# Patient Record
Sex: Male | Born: 1955 | Race: White | Hispanic: No | Smoking: Former smoker
Health system: Southern US, Community
[De-identification: ages and names within clinical notes are randomized; demographics above are authoritative.]

## PROBLEM LIST (undated history)

## (undated) DIAGNOSIS — I10 Essential (primary) hypertension: Secondary | ICD-10-CM

## (undated) DIAGNOSIS — Z86718 Personal history of other venous thrombosis and embolism: Secondary | ICD-10-CM

## (undated) DIAGNOSIS — Z944 Liver transplant status: Secondary | ICD-10-CM

## (undated) DIAGNOSIS — K5190 Ulcerative colitis, unspecified, without complications: Secondary | ICD-10-CM

## (undated) HISTORY — DX: Essential (primary) hypertension: I10

## (undated) HISTORY — DX: Ulcerative colitis, unspecified, without complications: K51.90

## (undated) HISTORY — PX: APPENDECTOMY: SHX54

## (undated) HISTORY — DX: Liver transplant status: Z94.4

## (undated) HISTORY — DX: Personal history of other venous thrombosis and embolism: Z86.718

---

## 1999-10-17 HISTORY — PX: COLECTOMY: SHX59

## 2007-05-17 HISTORY — PX: LIVER TRANSPLANT: SHX410

## 2011-09-16 HISTORY — PX: REPLACEMENT TOTAL KNEE: SUR1224

## 2016-06-29 ENCOUNTER — Ambulatory Visit (INDEPENDENT_AMBULATORY_CARE_PROVIDER_SITE_OTHER): Payer: BLUE CROSS/BLUE SHIELD | Admitting: Osteopathic Medicine

## 2016-06-29 ENCOUNTER — Encounter: Payer: Self-pay | Admitting: Osteopathic Medicine

## 2016-06-29 VITALS — BP 182/120 | HR 69 | Ht 68.0 in | Wt 223.0 lb

## 2016-06-29 DIAGNOSIS — Z86718 Personal history of other venous thrombosis and embolism: Secondary | ICD-10-CM | POA: Insufficient documentation

## 2016-06-29 DIAGNOSIS — Z23 Encounter for immunization: Secondary | ICD-10-CM

## 2016-06-29 DIAGNOSIS — Z139 Encounter for screening, unspecified: Secondary | ICD-10-CM

## 2016-06-29 DIAGNOSIS — I10 Essential (primary) hypertension: Secondary | ICD-10-CM | POA: Diagnosis not present

## 2016-06-29 DIAGNOSIS — Z944 Liver transplant status: Secondary | ICD-10-CM

## 2016-06-29 DIAGNOSIS — I8393 Asymptomatic varicose veins of bilateral lower extremities: Secondary | ICD-10-CM

## 2016-06-29 DIAGNOSIS — Z9049 Acquired absence of other specified parts of digestive tract: Secondary | ICD-10-CM | POA: Insufficient documentation

## 2016-06-29 DIAGNOSIS — I8390 Asymptomatic varicose veins of unspecified lower extremity: Secondary | ICD-10-CM | POA: Insufficient documentation

## 2016-06-29 HISTORY — DX: Liver transplant status: Z94.4

## 2016-06-29 HISTORY — DX: Personal history of other venous thrombosis and embolism: Z86.718

## 2016-06-29 LAB — COMPLETE METABOLIC PANEL WITH GFR
ALBUMIN: 4 g/dL (ref 3.6–5.1)
ALK PHOS: 76 U/L (ref 40–115)
ALT: 23 U/L (ref 9–46)
AST: 18 U/L (ref 10–35)
BUN: 19 mg/dL (ref 7–25)
CO2: 24 mmol/L (ref 20–31)
Calcium: 9.1 mg/dL (ref 8.6–10.3)
Chloride: 105 mmol/L (ref 98–110)
Creat: 1.19 mg/dL (ref 0.70–1.25)
GFR, EST NON AFRICAN AMERICAN: 65 mL/min (ref >=60)
GFR, Est African American: 75 mL/min (ref >=60)
GLUCOSE: 91 mg/dL (ref 65–99)
POTASSIUM: 4.3 mmol/L (ref 3.5–5.3)
SODIUM: 140 mmol/L (ref 135–146)
Total Bilirubin: 0.5 mg/dL (ref 0.2–1.2)
Total Protein: 7.5 g/dL (ref 6.1–8.1)

## 2016-06-29 LAB — HEMOGLOBIN A1C
HEMOGLOBIN A1C: 5.5 % (ref <5.7)
MEAN PLASMA GLUCOSE: 111 mg/dL

## 2016-06-29 LAB — LIPID PANEL
CHOLESTEROL: 220 mg/dL — AB (ref 125–200)
HDL: 51 mg/dL (ref >=40)
LDL Cholesterol: 139 mg/dL — ABNORMAL HIGH (ref <130)
TRIGLYCERIDES: 150 mg/dL — AB (ref <150)
Total CHOL/HDL Ratio: 4.3 Ratio (ref <=5.0)
VLDL: 30 mg/dL (ref <30)

## 2016-06-29 LAB — CBC WITH DIFFERENTIAL/PLATELET
BASOS ABS: 92 {cells}/uL (ref 0–200)
Basophils Relative: 1 %
EOS PCT: 2 %
Eosinophils Absolute: 184 cells/uL (ref 15–500)
HCT: 43.9 % (ref 38.5–50.0)
HEMOGLOBIN: 14.4 g/dL (ref 13.2–17.1)
LYMPHS PCT: 16 %
Lymphs Abs: 1472 cells/uL (ref 850–3900)
MCH: 29.4 pg (ref 27.0–33.0)
MCHC: 32.8 g/dL (ref 32.0–36.0)
MCV: 89.6 fL (ref 80.0–100.0)
MONOS PCT: 8 %
MPV: 11.1 fL (ref 7.5–12.5)
Monocytes Absolute: 736 cells/uL (ref 200–950)
NEUTROS PCT: 73 %
Neutro Abs: 6716 cells/uL (ref 1500–7800)
Platelets: 226 10*3/uL (ref 140–400)
RBC: 4.9 MIL/uL (ref 4.20–5.80)
RDW: 14 % (ref 11.0–15.0)
WBC: 9.2 10*3/uL (ref 3.8–10.8)

## 2016-06-29 LAB — TSH: TSH: 2.81 m[IU]/L (ref 0.40–4.50)

## 2016-06-29 NOTE — Progress Notes (Signed)
HPI: Paul Fuentes is a 60 y.o. male  who presents to [name] today, 06/29/16,  for chief complaint of:  Chief Complaint  Patient presents with  . Establish Care    Varicose veins: Patient states that he is here to get another opinion from PCP which would be required for him to proceed with corrective procedure for varicose veins. Varicose veins diffusely on lower extremities, occasionally phlebitis issues, pain. Mother had similar issues. Patient has already been in consultation with hematologist regarding these, in consultation with [facility] as well. See below for review of records available  Hypertension: Blood pressure markedly elevated today and on review of previous records. Patient states that about 5 years ago he was put on medication but this made him "feel really terrible" so he didn't want to be on blood pressure medication. Patient denies chest pain, pressure, shortness of breath, headache or vision changes  Records reviewed:   History of distal right lower extremity DVT in 1998, treated with 3 months of warfarin.   History of unprovoked distal left lower extra may DVT in 2000, 6 weeks of enoxaparin.   History of autoimmune hepatitis and liver transplant in 2008, complicated by lower extremity DVT.   History of ulcerative colitis, status post colectomy 2001  Hematology consult 03/2016. Regarding recommendation for treatment of her current pains, patient with multiple DVT, vascular surgery requested second opinion. Hematology noted would be reasonable to recommend anticoagulation but patient was not interested in this. Was encouraged to consider prophylactic anticoagulation in increased risk areas such as surgery. Recommended rivaroxaban 10 mg daily or apixiban 2.5 mg twice a day 3-5 days before a procedure and up to one month after the procedure.  Blood pressure 169/113 per records in June 2017  Past medical, surgical, social  and family history reviewed: Past Medical History:  Diagnosis Date  . Hypertension    Past Surgical History:  Procedure Laterality Date  . COLECTOMY  2001  . LIVER TRANSPLANT  05/2007  . REPLACEMENT TOTAL KNEE  09/2011   Social History  Substance Use Topics  . Smoking status: Unknown If Ever Smoked  . Smokeless tobacco: Never Used  . Alcohol use Not on file   History reviewed. No pertinent family history.   Current medication list and allergy/intolerance information reviewed:   Current Outpatient Prescriptions  Medication Sig Dispense Refill  . mycophenolate (CELLCEPT) 500 MG tablet Take 500 mg by mouth daily.    Marland Kitchen omeprazole (PRILOSEC) 20 MG capsule Take 20 mg by mouth 2 (two) times daily.  0  . predniSONE (DELTASONE) 5 MG tablet Take 5 mg by mouth daily.  5  . tacrolimus (PROGRAF) 1 MG capsule Take 1 mg by mouth 2 (two) times daily.     No current facility-administered medications for this visit.    No Known Allergies    Review of Systems:  Constitutional:  No  fever, no chills, No recent illness, No unintentional weight changes. No significant fatigue.   HEENT: No  headache, no vision change, no hearing change, No sore throat, No  sinus pressure  Cardiac: No  chest pain, No  pressure, No palpitations, No  Orthopnea  Respiratory:  No  shortness of breath. No  Cough  Gastrointestinal: No  abdominal pain, No  nausea, No  vomiting,  No  blood in stool, No  diarrhea, No  constipation   Musculoskeletal: No new myalgia/arthralgia  Genitourinary: No  incontinence, No  abnormal genital bleeding, No abnormal  genital discharge  Skin: No  Rash, No other wounds/concerning lesions  Hem/Onc: (+)easy bruising/bleeding, No  abnormal lymph node  Endocrine: No cold intolerance,  No heat intolerance. No polyuria/polydipsia/polyphagia   Neurologic: No  weakness, No  dizziness, No  slurred speech/focal weakness/facial droop  Psychiatric: No  concerns with depression, No  concerns  with anxiety, No sleep problems, No mood problems  Exam:  BP (!) 182/120   Pulse 69   Ht 5\' 8"  (1.727 m)   Wt 223 lb (101.2 kg)   BMI 33.91 kg/m   Constitutional: VS see above. General Appearance: alert, well-developed, well-nourished, NAD  Eyes: Normal lids and conjunctive, non-icteric sclera  Ears, Nose, Mouth, Throat: MMM, Normal external inspection ears/nares/mouth/lips/gums.   Neck: No masses, trachea midline. No thyroid enlargement. No tenderness/mass appreciated. No lymphadenopathy  Respiratory: Normal respiratory effort. no wheeze, no rhonchi, no rales  Cardiovascular: S1/S2 normal, no murmur, no rub/gallop auscultated. RRR. No lower extremity edema. Superficial varicose veins on lower extremities bilaterally, none are significantly bulging or appearing infected   Musculoskeletal: Gait normal. No clubbing/cyanosis of digits.   Neurological: Normal balance/coordination. No tremor.   Skin: warm, dry, intact.  Psychiatric: Normal judgment/insight. Normal mood and affect. Oriented x3.    ASSESSMENT/PLAN:   Patient would like to wait for me to review records to see what blood pressure medication he's been on before, he is open to starting new blood pressure medication, but probably not today. Advised I would go ahead and write letter informing that I don't see a problem with undergoing varicose vein procedure, however his hypertension is certainly a complicating factor may put him at risk for such a procedure  Return to clinic for further follow-up on blood pressure, also immunocompromised state, we'll need to make sure that he is up-to-date on all vaccines, await records for review   Varicose vein of leg  Essential hypertension - Plan: CBC with Differential/Platelet, COMPLETE METABOLIC PANEL WITH GFR, Lipid panel, VITAMIN D 25 Hydroxy (Vit-D Deficiency, Fractures), TSH, Hemoglobin A1c  Need for prophylactic vaccination and inoculation against influenza - Plan: Flu  Vaccine QUAD 36+ mos IM  History of DVT (deep vein thrombosis)  Liver transplant recipient [facility])  History of total colectomy  Screening - Plan: Hepatitis C antibody, HIV antibody    Patient Instructions  What we did today:  I'm going to send letter recommending for varicose vein surgery/procedure, will also make notation of elevated blood pressure as this may affect your safety for such a procedure.  We'll request records from previous doctor about other medications tried for blood pressure.  Labs done today, we should have the results of these back in 2-3 days.  What to do next:  Plan to follow-up in this office in 2 weeks, by then I should have records from previous blood pressure treatments, we can decide on a strategy to help bring her blood pressure under control.  Let me know if you have any questions or concerns in the meantime, or if you don't hear back from us about your labs.  If any labs are significantly abnormal requiring a further discussion or urgent evaluation, we may ask you to schedule an appointment.  Let us know if you have any questions or concerns! Take care! -Dr. Mervyn SkeetersA.     Visit summary with medication list and pertinent instructions was printed for patient to review. All questions at time of visit were answered - patient instructed to contact office with any additional concerns. ER/RTC precautions were reviewed  with the patient. Follow-up plan: Return in about 2 weeks (around 07/13/2016), or sooner if needed, for BLOOD PRESSURE FOLLOW-UP.  Note: Total time spent 45 minutes, greater than 50% of the visit was spent face-to-face counseling and coordinating care for the following: The primary encounter diagnosis was Varicose vein of leg. Diagnoses of Essential hypertension, Need for prophylactic vaccination and inoculation against influenza, History of DVT (deep vein thrombosis), Liver transplant recipient [facility]), History of total colectomy, and Screening were also  pertinent to this visit.Marland Kitchen

## 2016-06-29 NOTE — Patient Instructions (Signed)
What we did today:  I'm going to send letter recommending for varicose vein surgery/procedure, will also make notation of elevated blood pressure as this may affect your safety for such a procedure.  We'll request records from previous doctor about other medications tried for blood pressure.  Labs done today, we should have the results of these back in 2-3 days.  What to do next:  Plan to follow-up in this office in 2 weeks, by then I should have records from previous blood pressure treatments, we can decide on a strategy to help bring her blood pressure under control.  Let me know if you have any questions or concerns in the meantime, or if you don't hear back from us about your labs.  If any labs are significantly abnormal requiring a further discussion or urgent evaluation, we may ask you to schedule an appointment.  Let us know if you have any questions or concerns! Take care! -Dr. Mervyn SkeetersA.

## 2016-06-30 ENCOUNTER — Encounter: Payer: Self-pay | Admitting: Osteopathic Medicine

## 2016-06-30 LAB — HEPATITIS C ANTIBODY: HCV AB: NEGATIVE

## 2016-06-30 LAB — VITAMIN D 25 HYDROXY (VIT D DEFICIENCY, FRACTURES): VIT D 25 HYDROXY: 40 ng/mL (ref 30–100)

## 2016-06-30 LAB — HIV ANTIBODY (ROUTINE TESTING W REFLEX): HIV 1&2 Ab, 4th Generation: NONREACTIVE

## 2016-07-13 ENCOUNTER — Ambulatory Visit (INDEPENDENT_AMBULATORY_CARE_PROVIDER_SITE_OTHER): Payer: BLUE CROSS/BLUE SHIELD | Admitting: Osteopathic Medicine

## 2016-07-13 ENCOUNTER — Encounter: Payer: Self-pay | Admitting: Osteopathic Medicine

## 2016-07-13 VITALS — BP 176/133 | HR 71 | Wt 221.0 lb

## 2016-07-13 DIAGNOSIS — I10 Essential (primary) hypertension: Secondary | ICD-10-CM

## 2016-07-13 DIAGNOSIS — Z944 Liver transplant status: Secondary | ICD-10-CM

## 2016-07-13 DIAGNOSIS — Z9049 Acquired absence of other specified parts of digestive tract: Secondary | ICD-10-CM

## 2016-07-13 DIAGNOSIS — Z23 Encounter for immunization: Secondary | ICD-10-CM | POA: Diagnosis not present

## 2016-07-13 DIAGNOSIS — Z7189 Other specified counseling: Secondary | ICD-10-CM

## 2016-07-13 MED ORDER — CHLORTHALIDONE 25 MG PO TABS: 25.0000 mg | ORAL_TABLET | Freq: Every day | ORAL | 2 refills | Status: DC

## 2016-07-13 NOTE — Progress Notes (Signed)
HPI: Paul Fuentes is a 60 y.o. male  who presents to [facility] today, 07/13/16,  for chief complaint of:  Chief Complaint  Patient presents with  . Follow-up    BLOOD PRESSURE    Varicose veins: Patient states that he is here to get another opinion from PCP which would be required for him to proceed with corrective procedure for varicose veins.Letter was written last visit, I think it was faxed that I cannot confirm this.  Hypertension: Blood pressure markedly elevated today and on review of previous records. Patient states that about 5 years ago he was put on medication but this made him "feel really terrible" so he didn't want to be on blood pressure medication. Patient denies chest pain, pressure, shortness of breath, headache or vision changes. He is amenable to starting antihypertensive medications, but he states he would rather live with a higher risk of heart attack/stroke then to be constantly dealing with the fatigue and bad/ill feeling he was having initially when he was on medications prior to this        History of distal right lower extremity DVT in 1998, treated with 3 months of warfarin.   History of unprovoked distal left lower extra may DVT in 2000, 6 weeks of enoxaparin.   History of autoimmune hepatitis and liver transplant in 1610, complicated by lower extremity DVT.   History of ulcerative colitis, status post colectomy 2001  Hematology consult 03/2016. Regarding recommendation for treatment of her current pains, patient with multiple DVT, vascular surgery requested second opinion. Hematology noted would be reasonable to recommend anticoagulation but patient was not interested in this. Was encouraged to consider prophylactic anticoagulation in increased risk areas such as surgery. Recommended rivaroxaban 10 mg daily or apixiban 2.5 mg twice a day 3-5 days before a procedure and up to one month after the procedure.  Blood pressure  169/113 per records in June 2017  Past medical, surgical, social and family history reviewed: Past Medical History:  Diagnosis Date  . Hypertension    Past Surgical History:  Procedure Laterality Date  . COLECTOMY  2001  . LIVER TRANSPLANT  05/2007  . REPLACEMENT TOTAL KNEE  09/2011   Social History  Substance Use Topics  . Smoking status: Former Research scientist (life sciences)  . Smokeless tobacco: Never Used  . Alcohol use No   No family history on file.   Current medication list and allergy/intolerance information reviewed:   Current Outpatient Prescriptions  Medication Sig Dispense Refill  . mycophenolate (CELLCEPT) 500 MG tablet Take 500 mg by mouth daily.    Marland Kitchen omeprazole (PRILOSEC) 20 MG capsule Take 20 mg by mouth 2 (two) times daily.  0  . predniSONE (DELTASONE) 5 MG tablet Take 5 mg by mouth daily with breakfast.    . tacrolimus (PROGRAF) 1 MG capsule Take 1 mg by mouth 2 (two) times daily.     No current facility-administered medications for this visit.    No Known Allergies    Review of Systems:  Constitutional:  No recent illness,  HEENT: No  headache, no vision change,  Cardiac: No  chest pain, No  pressur  Respiratory:  No  shortness of breath.   Psychiatric: No  concerns with depression, No  concerns with anxiety, No sleep problems, No mood problems  Exam:  BP (!) 176/133   Pulse 71   Wt 221 lb (100.2 kg)   BMI 33.60 kg/m   Constitutional: VS see above. General Appearance: alert, well-developed,  well-nourished, NAD  Ears, Nose, Mouth, Throat: MMM,  Neck: No masses, trachea midline.   Respiratory: Normal respiratory effort. no wheeze, no rhonchi, no rales  Cardiovascular: S1/S2 normal, no murmur, no rub/gallop auscultated. RRR. No lower extremity edema.  Musculoskeletal: Gait normal.  Psychiatric: Normal judgment/insight. Normal mood and affect. Oriented x3.   Recent Results (from the past 2160 hour(s))  CBC with Differential/Platelet     Status: None    Collection Time: 06/29/16 10:11 AM  Result Value Ref Range   WBC 9.2 3.8 - 10.8 K/uL   RBC 4.90 4.20 - 5.80 MIL/uL   Hemoglobin 14.4 13.2 - 17.1 g/dL   HCT 36.6 47.0 - 43.5 %   MCV 89.6 80.0 - 100.0 fL   MCH 29.4 27.0 - 33.0 pg   MCHC 32.8 32.0 - 36.0 g/dL   RDW 57.8 00.6 - 32.1 %   Platelets 226 140 - 400 K/uL   MPV 11.1 7.5 - 12.5 fL   Neutro Abs 6,716 1,500 - 7,800 cells/uL   Lymphs Abs 1,472 850 - 3,900 cells/uL   Monocytes Absolute 736 200 - 950 cells/uL   Eosinophils Absolute 184 15 - 500 cells/uL   Basophils Absolute 92 0 - 200 cells/uL   Neutrophils Relative % 73 %   Lymphocytes Relative 16 %   Monocytes Relative 8 %   Eosinophils Relative 2 %   Basophils Relative 1 %   Smear Review Criteria for review not met   COMPLETE METABOLIC PANEL WITH GFR     Status: None   Collection Time: 06/29/16 10:11 AM  Result Value Ref Range   Sodium 140 135 - 146 mmol/L   Potassium 4.3 3.5 - 5.3 mmol/L   Chloride 105 98 - 110 mmol/L   CO2 24 20 - 31 mmol/L   Glucose, Bld 91 65 - 99 mg/dL   BUN 19 7 - 25 mg/dL   Creat 5.31 9.81 - 3.16 mg/dL    Comment:   For patients > or = 63 years of age: The upper reference limit for Creatinine is approximately 13% higher for people identified as African-American.      Total Bilirubin 0.5 0.2 - 1.2 mg/dL   Alkaline Phosphatase 76 40 - 115 U/L   AST 18 10 - 35 U/L   ALT 23 9 - 46 U/L   Total Protein 7.5 6.1 - 8.1 g/dL   Albumin 4.0 3.6 - 5.1 g/dL   Calcium 9.1 8.6 - 55.3 mg/dL   GFR, Est African American 75 >=60 mL/min   GFR, Est Non African American 65 >=60 mL/min  Hepatitis C antibody     Status: None   Collection Time: 06/29/16 10:11 AM  Result Value Ref Range   HCV Ab NEGATIVE NEGATIVE  HIV antibody     Status: None   Collection Time: 06/29/16 10:11 AM  Result Value Ref Range   HIV 1&2 Ab, 4th Generation NONREACTIVE NONREACTIVE    Comment:   HIV-1 antigen and HIV-1/HIV-2 antibodies were not detected.  There is no laboratory  evidence of HIV infection.   HIV-1/2 Antibody Diff        Not indicated. HIV-1 RNA, Qual TMA          Not indicated.     PLEASE NOTE: This information has been disclosed to you from records whose confidentiality may be protected by state law. If your state requires such protection, then the state law prohibits you from making any further disclosure of the information without the  specific written consent of the person to whom it pertains, or as otherwise permitted by law. A general authorization for the release of medical or other information is NOT sufficient for this purpose.   The performance of this assay has not been clinically validated in patients less than 16 years old.   For additional information please refer to http://education.quest[facility].com/faq/FAQ106.  (This link is being provided for informational/educational purposes only.)     Lipid panel     Status: Abnormal   Collection Time: 06/29/16 10:11 AM  Result Value Ref Range   Cholesterol 220 (H) 125 - 200 mg/dL   Triglycerides 150 (H) <150 mg/dL   HDL 51 >=40 mg/dL   Total CHOL/HDL Ratio 4.3 <=5.0 Ratio   VLDL 30 <30 mg/dL   LDL Cholesterol 139 (H) <130 mg/dL    Comment:   Total Cholesterol/HDL Ratio:CHD Risk                        Coronary Heart Disease Risk Table                                        Men       Women          1/2 Average Risk              3.4        3.3              Average Risk              5.0        4.4           2X Average Risk              9.6        7.1           3X Average Risk             23.4       11.0 Use the calculated Patient Ratio above and the CHD Risk table  to determine the patient's CHD Risk.   VITAMIN D 25 Hydroxy (Vit-D Deficiency, Fractures)     Status: None   Collection Time: 06/29/16 10:11 AM  Result Value Ref Range   Vit D, 25-Hydroxy 40 30 - 100 ng/mL    Comment: Vitamin D Status           25-OH Vitamin D        Deficiency                <20 ng/mL         Insufficiency         20 - 29 ng/mL        Optimal             > or = 30 ng/mL   For 25-OH Vitamin D testing on patients on D2-supplementation and patients for whom quantitation of D2 and D3 fractions is required, the [facility] 25-OH VIT D, (D2,D3), LC/MS/MS is recommended: order code [id number] (patients > 2 yrs).   TSH     Status: None   Collection Time: 06/29/16 10:11 AM  Result Value Ref Range   TSH 2.81 0.40 - 4.50 mIU/L  Hemoglobin A1c     Status: None   Collection Time: 06/29/16 10:11 AM  Result Value Ref Range  Hgb A1c MFr Bld 5.5 <5.7 %    Comment:   For the purpose of screening for the presence of diabetes:   <5.7%       Consistent with the absence of diabetes 5.7-6.4 %   Consistent with increased risk for diabetes (prediabetes) >=6.5 %     Consistent with diabetes   This assay result is consistent with a decreased risk of diabetes.   Currently, no consensus exists regarding use of hemoglobin A1c for diagnosis of diabetes in children.   According to American Diabetes Association (ADA) guidelines, hemoglobin A1c <7.0% represents optimal control in non-pregnant diabetic patients. Different metrics may apply to specific patient populations. Standards of Medical Care in Diabetes (ADA).      Mean Plasma Glucose 111 mg/dL   The above lab results were reviewed in detail with the patient  ASSESSMENT/PLAN:   Essential hypertension - Plan: chlorthalidone (HYGROTON) 25 MG tablet  Need for diphtheria-tetanus-pertussis (Tdap) vaccine, adult/adolescent - Plan: Tdap vaccine greater than or equal to 7yo IM  Liver transplant recipient [facility])  History of total colectomy  Cardiac risk counseling - ASCVD risk nearly 18% in the next 10 years, much improved if blood pressure gets to goal    Patient Instructions  For getting blood pressure under better control, we are starting with low-dose medication for the first 2 weeks, we'll probably plan to increase this medicine. We will  need to do periodic monitoring of your kidney function over the course of the next few months. Plan to return to the office in 2 weeks for blood pressure check and to see how you're doing on the medication.   If you have any questions or concerns, or if there is anything else our office can do for you in the meantime, please don't hesitate to call!   Patient is a hadn't heard back from the vein center, I reprinted the letter for him to have, let me know if his anything else we can do for him  Visit summary with medication list and pertinent instructions was printed for patient to review. All questions at time of visit were answered - patient instructed to contact office with any additional concerns. ER/RTC precautions were reviewed with the patient. Follow-up plan: Return in about 2 weeks (around 07/27/2016) for blood pressure recheck on new medication.  Note: Total time spent 25 minutes, greater than 50% of the visit was spent face-to-face counseling and coordinating care for the following: The primary encounter diagnosis was Essential hypertension. Diagnoses of Need for diphtheria-tetanus-pertussis (Tdap) vaccine, adult/adolescent, Liver transplant recipient [facility]), History of total colectomy, and Cardiac risk counseling were also pertinent to this visit.Marland Kitchen

## 2016-07-13 NOTE — Patient Instructions (Addendum)
For getting blood pressure under better control, we are starting with low-dose medication for the first 2 weeks, we'll probably plan to increase this medicine. We will need to do periodic monitoring of your kidney function over the course of the next few months. Plan to return to the office in 2 weeks for blood pressure check and to see how you're doing on the medication.   If you have any questions or concerns, or if there is anything else our office can do for you in the meantime, please don't hesitate to call!

## 2016-07-27 ENCOUNTER — Encounter: Payer: Self-pay | Admitting: Osteopathic Medicine

## 2016-07-27 ENCOUNTER — Ambulatory Visit (INDEPENDENT_AMBULATORY_CARE_PROVIDER_SITE_OTHER): Payer: BLUE CROSS/BLUE SHIELD | Admitting: Osteopathic Medicine

## 2016-07-27 VITALS — BP 154/103 | HR 83 | Ht 68.0 in | Wt 219.0 lb

## 2016-07-27 DIAGNOSIS — Z7189 Other specified counseling: Secondary | ICD-10-CM

## 2016-07-27 DIAGNOSIS — I10 Essential (primary) hypertension: Secondary | ICD-10-CM | POA: Diagnosis not present

## 2016-07-27 NOTE — Progress Notes (Signed)
HPI: Paul Fuentes is a 60 y.o. male  who presents to [facility] today, 07/27/16,  for chief complaint of:  Chief Complaint  Patient presents with  . Follow-up  . Hypertension    Hypertension: Patient denies chest pain, pressure, shortness of breath, headache or vision changes. He is currently taking one half tablet of the chlorthalidone 25 mg, he tried going up to one whole tablet but was experiencing some feelings of fatigue, has noticed a change in his libido since starting the medication. In the past, had trouble with medications causing tiredness, fatigue, just not feeling right. Patient and I have agreed to go slow with the medications, he had significant hypertension for a long time and has been resistant to starting medicines until now.       Past medical, surgical, social and family history reviewed: Past Medical History:  Diagnosis Date  . 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  History of unprovoked distal left lower extra may DVT in 2000, 6 weeks of enoxaparin.  History of autoimmune hepatitis and liver transplant in 4481, complicated by lower extremity DVT.  History of ulcerative colitis, status post colectomy 2001 Hematology consult 03/2016. Regarding recommendation for treatment of her current pains, patient with multiple DVT, vascular surgery requested second opinion. Hematology noted would be reasonable to recommend anticoagulation but patient was not interested in this. Was encouraged to consider prophylactic anticoagulation in increased risk areas such as surgery. Recommended rivaroxaban 10 mg daily or apixiban 2.5 mg twice a day 3-5 days before a procedure and up to one month after the procedure. Blood pressure 169/113 per records in June 2017  . History of total colectomy 06/29/2016  . Hypertension   . Liver transplant recipient [facility]) 06/29/2016    Past Surgical History:  Procedure Laterality Date  . COLECTOMY  2001  . LIVER TRANSPLANT  05/2007  . REPLACEMENT TOTAL KNEE  09/2011   Social History  Substance Use Topics  . Smoking status: Former Research scientist (life sciences)  . Smokeless tobacco: Never Used  . Alcohol use No   No family history on file.   Current medication list and allergy/intolerance information reviewed:   Current Outpatient Prescriptions  Medication Sig Dispense Refill  . chlorthalidone (HYGROTON) 25 MG tablet Take 1 tablet (25 mg total) by mouth daily. (Can start with 1/2 tablet - 12.5 mg total - for first two weeks) 30 tablet 2  . mycophenolate (CELLCEPT) 500 MG tablet Take 500 mg by mouth daily.    Marland Kitchen omeprazole (PRILOSEC) 20 MG capsule Take 20 mg by mouth 2 (two) times daily.  0  . predniSONE (DELTASONE) 5 MG tablet Take 5 mg by mouth daily with breakfast.    . tacrolimus (PROGRAF) 1 MG capsule Take 1 mg by mouth 2 (two) times daily.     No current facility-administered medications for this visit.    No Known Allergies    Review of Systems:  Constitutional:  No recent illness,  HEENT: No  headache, no vision change  Cardiac: No  chest pain, No  pressure  Respiratory:  No  shortness of breath.   Exam:  BP (!) 154/103   Pulse 83   Ht 5' 8" (1.727 m)   Wt 219 lb (99.3 kg)   BMI 33.30 kg/m   Constitutional: VS see above. General Appearance: alert, well-developed, well-nourished, NAD  Ears, Nose, Mouth, Throat: MMM  Neck: No masses, trachea midline.   Respiratory: Normal respiratory effort. no wheeze, no rhonchi, no rales  Cardiovascular: S1/S2 normal, no murmur, no rub/gallop auscultated. RRR. No lower extremity edema.   Recent Results (from the past 2160 hour(s))  CBC with Differential/Platelet     Status: None   Collection Time: 06/29/16 10:11 AM  Result Value Ref Range   WBC 9.2 3.8 - 10.8 K/uL   RBC 4.90 4.20 - 5.80 MIL/uL   Hemoglobin 14.4 13.2 - 17.1 g/dL   HCT 43.9 38.5 - 50.0 %   MCV 89.6  80.0 - 100.0 fL   MCH 29.4 27.0 - 33.0 pg   MCHC 32.8 32.0 - 36.0 g/dL   RDW 14.0 11.0 - 15.0 %   Platelets 226 140 - 400 K/uL   MPV 11.1 7.5 - 12.5 fL   Neutro Abs 6,716 1,500 - 7,800 cells/uL   Lymphs Abs 1,472 850 - 3,900 cells/uL   Monocytes Absolute 736 200 - 950 cells/uL   Eosinophils Absolute 184 15 - 500 cells/uL   Basophils Absolute 92 0 - 200 cells/uL   Neutrophils Relative % 73 %   Lymphocytes Relative 16 %   Monocytes Relative 8 %   Eosinophils Relative 2 %   Basophils Relative 1 %   Smear Review Criteria for review not met   COMPLETE METABOLIC PANEL WITH GFR     Status: None   Collection Time: 06/29/16 10:11 AM  Result Value Ref Range   Sodium 140 135 - 146 mmol/L   Potassium 4.3 3.5 - 5.3 mmol/L   Chloride 105 98 - 110 mmol/L   CO2 24 20 - 31 mmol/L   Glucose, Bld 91 65 - 99 mg/dL   BUN 19 7 - 25 mg/dL   Creat 1.19 0.70 - 1.25 mg/dL    Comment:   For patients > or = 62 years of age: The upper reference limit for Creatinine is approximately 13% higher for people identified as African-American.      Total Bilirubin 0.5 0.2 - 1.2 mg/dL   Alkaline Phosphatase 76 40 - 115 U/L   AST 18 10 - 35 U/L   ALT 23 9 - 46 U/L   Total Protein 7.5 6.1 - 8.1 g/dL   Albumin 4.0 3.6 - 5.1 g/dL   Calcium 9.1 8.6 - 10.3 mg/dL   GFR, Est African American 75 >=60 mL/min   GFR, Est Non African American 65 >=60 mL/min  Hepatitis C antibody     Status: None   Collection Time: 06/29/16 10:11 AM  Result Value Ref Range   HCV Ab NEGATIVE NEGATIVE  HIV antibody     Status: None   Collection Time: 06/29/16 10:11 AM  Result Value Ref Range   HIV 1&2 Ab, 4th Generation NONREACTIVE NONREACTIVE    Comment:   HIV-1 antigen and HIV-1/HIV-2 antibodies were not detected.  There is no laboratory evidence of HIV infection.   HIV-1/2 Antibody Diff        Not indicated. HIV-1 RNA, Qual TMA          Not indicated.     PLEASE NOTE: This information has been disclosed to you from  records whose confidentiality may be protected by state law. If your state requires such protection, then the state law prohibits you from making any further disclosure of the information without the specific written consent of the person to whom it pertains, or as otherwise permitted by law. A general authorization for the release of  medical or other information is NOT sufficient for this purpose.   The performance of this assay has not been clinically validated in patients less than 18 years old.   For additional information please refer to http://education.questdiagnostics.com/faq/FAQ106.  (This link is being provided for informational/educational purposes only.)     Lipid panel     Status: Abnormal   Collection Time: 06/29/16 10:11 AM  Result Value Ref Range   Cholesterol 220 (H) 125 - 200 mg/dL   Triglycerides 150 (H) <150 mg/dL   HDL 51 >=40 mg/dL   Total CHOL/HDL Ratio 4.3 <=5.0 Ratio   VLDL 30 <30 mg/dL   LDL Cholesterol 139 (H) <130 mg/dL    Comment:   Total Cholesterol/HDL Ratio:CHD Risk                        Coronary Heart Disease Risk Table                                        Men       Women          1/2 Average Risk              3.4        3.3              Average Risk              5.0        4.4           2X Average Risk              9.6        7.1           3X Average Risk             23.4       11.0 Use the calculated Patient Ratio above and the CHD Risk table  to determine the patient's CHD Risk.   VITAMIN D 25 Hydroxy (Vit-D Deficiency, Fractures)     Status: None   Collection Time: 06/29/16 10:11 AM  Result Value Ref Range   Vit D, 25-Hydroxy 40 30 - 100 ng/mL    Comment: Vitamin D Status           25-OH Vitamin D        Deficiency                <20 ng/mL        Insufficiency         20 - 29 ng/mL        Optimal             > or = 30 ng/mL   For 25-OH Vitamin D testing on patients on D2-supplementation and patients for whom quantitation of D2 and  D3 fractions is required, the [facility] 25-OH VIT D, (D2,D3), LC/MS/MS is recommended: order code [id number] (patients > 2 yrs).   TSH     Status: None   Collection Time: 06/29/16 10:11 AM  Result Value Ref Range   TSH 2.81 0.40 - 4.50 mIU/L  Hemoglobin A1c     Status: None   Collection Time: 06/29/16 10:11 AM  Result Value Ref Range   Hgb A1c MFr Bld 5.5 <5.7 %    Comment:   For the purpose of screening for the presence of diabetes:   <  5.7%       Consistent with the absence of diabetes 5.7-6.4 %   Consistent with increased risk for diabetes (prediabetes) >=6.5 %     Consistent with diabetes   This assay result is consistent with a decreased risk of diabetes.   Currently, no consensus exists regarding use of hemoglobin A1c for diagnosis of diabetes in children.   According to American Diabetes Association (ADA) guidelines, hemoglobin A1c <7.0% represents optimal control in non-pregnant diabetic patients. Different metrics may apply to specific patient populations. Standards of Medical Care in Diabetes (ADA).      Mean Plasma Glucose 111 mg/dL    ASSESSMENT/PLAN:   Essential hypertension  Cardiac risk counseling   Okay to continue at half versus whole tablet of the chlorthalidone. Consider medication change if side effects persist or if patient is unable to tolerate higher dose can consider additional medication.   Visit summary with medication list and pertinent instructions was printed for patient to review. All questions at time of visit were answered - patient instructed to contact office with any additional concerns. ER/RTC precautions were reviewed with the patient. Follow-up plan: Return in about 6 weeks (around 09/07/2016) for BLOOD PRESSURE FOLLOW-UP, SOONER IF NEEDED.

## 2016-08-25 ENCOUNTER — Ambulatory Visit (INDEPENDENT_AMBULATORY_CARE_PROVIDER_SITE_OTHER): Payer: BLUE CROSS/BLUE SHIELD | Admitting: Family Medicine

## 2016-08-25 ENCOUNTER — Encounter: Payer: Self-pay | Admitting: Family Medicine

## 2016-08-25 ENCOUNTER — Ambulatory Visit (INDEPENDENT_AMBULATORY_CARE_PROVIDER_SITE_OTHER): Payer: BLUE CROSS/BLUE SHIELD

## 2016-08-25 VITALS — BP 143/93 | HR 98 | Ht 68.0 in | Wt 221.0 lb

## 2016-08-25 DIAGNOSIS — R059 Cough, unspecified: Secondary | ICD-10-CM

## 2016-08-25 DIAGNOSIS — Z944 Liver transplant status: Secondary | ICD-10-CM | POA: Diagnosis not present

## 2016-08-25 DIAGNOSIS — R05 Cough: Secondary | ICD-10-CM

## 2016-08-25 MED ORDER — BENZONATATE 200 MG PO CAPS: 200.0000 mg | ORAL_CAPSULE | Freq: Three times a day (TID) | ORAL | 0 refills | Status: DC | PRN

## 2016-08-25 MED ORDER — AZITHROMYCIN 250 MG PO TABS: 250.0000 mg | ORAL_TABLET | Freq: Every day | ORAL | 0 refills | Status: DC

## 2016-08-25 NOTE — Patient Instructions (Signed)
Thank you for coming in today. If your belly pain worsens, or you have high fever, bad vomiting, blood in your stool or black tarry stool go to the Emergency Room.  Take the medication prescribed.  Return as needed.  Call or go to the emergency room if you get worse, have trouble breathing, have chest pains, or palpitations.    Acute Bronchitis Bronchitis is inflammation of the airways that extend from the windpipe into the lungs (bronchi). The inflammation often causes mucus to develop. This leads to a cough, which is the most common symptom of bronchitis.  In acute bronchitis, the condition usually develops suddenly and goes away over time, usually in a couple weeks. Smoking, allergies, and asthma can make bronchitis worse. Repeated episodes of bronchitis may cause further lung problems.  CAUSES Acute bronchitis is most often caused by the same virus that causes a cold. The virus can spread from person to person (contagious) through coughing, sneezing, and touching contaminated objects. SIGNS AND SYMPTOMS   Cough.   Fever.   Coughing up mucus.   Body aches.   Chest congestion.   Chills.   Shortness of breath.   Sore throat.  DIAGNOSIS  Acute bronchitis is usually diagnosed through a physical exam. Your health care provider will also ask you questions about your medical history. Tests, such as chest X-rays, are sometimes done to rule out other conditions.  TREATMENT  Acute bronchitis usually goes away in a couple weeks. Oftentimes, no medical treatment is necessary. Medicines are sometimes given for relief of fever or cough. Antibiotic medicines are usually not needed but may be prescribed in certain situations. In some cases, an inhaler may be recommended to help reduce shortness of breath and control the cough. A cool mist vaporizer may also be used to help thin bronchial secretions and make it easier to clear the chest.  HOME CARE INSTRUCTIONS  Get plenty of rest.    Drink enough fluids to keep your urine clear or pale yellow (unless you have a medical condition that requires fluid restriction). Increasing fluids may help thin your respiratory secretions (sputum) and reduce chest congestion, and it will prevent dehydration.   Take medicines only as directed by your health care provider.  If you were prescribed an antibiotic medicine, finish it all even if you start to feel better.  Avoid smoking and secondhand smoke. Exposure to cigarette smoke or irritating chemicals will make bronchitis worse. If you are a smoker, consider using nicotine gum or skin patches to help control withdrawal symptoms. Quitting smoking will help your lungs heal faster.   Reduce the chances of another bout of acute bronchitis by washing your hands frequently, avoiding people with cold symptoms, and trying not to touch your hands to your mouth, nose, or eyes.   Keep all follow-up visits as directed by your health care provider.  SEEK MEDICAL CARE IF: Your symptoms do not improve after 1 week of treatment.  SEEK IMMEDIATE MEDICAL CARE IF:  You develop an increased fever or chills.   You have chest pain.   You have severe shortness of breath.  You have bloody sputum.   You develop dehydration.  You faint or repeatedly feel like you are going to pass out.  You develop repeated vomiting.  You develop a severe headache. MAKE SURE YOU:   Understand these instructions.  Will watch your condition.  Will get help right away if you are not doing well or get worse.   This information is  not intended to replace advice given to you by your health care provider. Make sure you discuss any questions you have with your health care provider.   Document Released: 11/09/2004 Document Revised: 10/23/2014 Document Reviewed: 03/25/2013 Elsevier Interactive Patient Education [employer].

## 2016-08-25 NOTE — Progress Notes (Signed)
Sheilah Minsric Kindt is a 60 y.o. male [name] today for cough. Patient is a 3 week history of moderately bothersome cough. Cough is nonproductive but does interfere with sleep. He notes a very occasional shortness of breath but denies any chest pain or palpitations. No fevers or chills. He's tried occasional over-the-counter medications which has not helped much. He is a pertinent medical history for liver transplant currently doing well with CellCept and prednisone and Prograf.   Past Medical History:  Diagnosis Date  . 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  History of unprovoked distal left lower extra may DVT in 2000, 6 weeks of enoxaparin.  History of autoimmune hepatitis and liver transplant in 2008, complicated by lower extremity DVT.  History of ulcerative colitis, status post colectomy 2001 Hematology consult 03/2016. Regarding recommendation for treatment of her current pains, patient with multiple DVT, vascular surgery requested second opinion. Hematology noted would be reasonable to recommend anticoagulation but patient was not interested in this. Was encouraged to consider prophylactic anticoagulation in increased risk areas such as surgery. Recommended rivaroxaban 10 mg daily or apixiban 2.5 mg twice a day 3-5 days before a procedure and up to one month after the procedure. Blood pressure 169/113 per records in June 2017  . History of total colectomy 06/29/2016  . Hypertension   . Liver transplant recipient [facility] 06/29/2016   Past Surgical History:  Procedure Laterality Date  . COLECTOMY  2001  . LIVER TRANSPLANT  05/2007  . REPLACEMENT TOTAL KNEE  09/2011   Social History  Substance Use Topics  . Smoking status: Former Games developermoker  . Smokeless tobacco: Never  Used  . Alcohol use No   family history is not on file.  ROS as above:  Medications: Current Outpatient Prescriptions  Medication Sig Dispense Refill  . chlorthalidone (HYGROTON) 25 MG tablet Take 1 tablet (25 mg total) by mouth daily. (Can start with 1/2 tablet - 12.5 mg total - for first two weeks) 30 tablet 2  . mycophenolate (CELLCEPT) 500 MG tablet Take 500 mg by mouth daily.    Marland Kitchen. omeprazole (PRILOSEC) 20 MG capsule Take 20 mg by mouth 2 (two) times daily.  0  . predniSONE (DELTASONE) 5 MG tablet Take 5 mg by mouth daily with breakfast.    . tacrolimus (PROGRAF) 1 MG capsule Take 1 mg by mouth 2 (two) times daily.     No current facility-administered medications for this visit.    No Known Allergies  Health Maintenance Health Maintenance  Topic Date Due  . ZOSTAVAX  12/15/2013  . COLONOSCOPY  07/13/2026  . TETANUS/TDAP  07/13/2026  . INFLUENZA VACCINE  Completed  . Hepatitis C Screening  Completed  . HIV Screening  Completed     Exam:  BP (!) 143/93   Pulse 98   Ht 5\' 8"  (1.727 m)   Wt 221 lb (100.2 kg)   SpO2 97%   BMI 33.60 kg/m  Gen: Well NAD HEENT: EOMI,  MMM  Lungs: Normal work of breathing. CTABL Heart: RRR no MRG Abd: NABS, Soft. Nondistended, Nontender Exts: Brisk capillary refill, warm and well perfused.   Chest x-ray: Preliminary review shows: Slight blunting of the anterior portion of the lung field seen on the lateral chest x-ray. Suspect this is due to postsurgical changes although I don't have  any prior films to compare to. Awaiting formal radiology review Awaiting formal radiology review   No results found for this or any previous visit (from the past 72 hour(s)). No results found.    Assessment and Plan: 60 y.o. male with cough for 3 weeks. Patient is well-appearing does not appear to be ill at all. He does have a concerning medical history for liver transplant and is mildly immunocompromised. Awaiting for radiology review from chest x-ray  although pneumonia is doubtful. Treat with azithromycin and Tessalon for cough suppression. Follow-up with PCP in the near future.   Orders Placed This Encounter  Procedures  . DG Chest 2 View    Order Specific Question:   Reason for exam:    Answer:   Cough, assess intra-thoracic pathology    Order Specific Question:   Preferred imaging [facility]?    [facility]    Discussed warning signs or symptoms. Please see discharge instructions. Patient expresses understanding.

## 2016-08-30 ENCOUNTER — Ambulatory Visit (INDEPENDENT_AMBULATORY_CARE_PROVIDER_SITE_OTHER): Payer: BLUE CROSS/BLUE SHIELD | Admitting: Family Medicine

## 2016-08-30 ENCOUNTER — Telehealth: Payer: Self-pay | Admitting: Family Medicine

## 2016-08-30 ENCOUNTER — Encounter: Payer: Self-pay | Admitting: Family Medicine

## 2016-08-30 ENCOUNTER — Ambulatory Visit: Payer: BLUE CROSS/BLUE SHIELD

## 2016-08-30 VITALS — BP 140/92 | HR 97 | Ht 68.0 in | Wt 228.0 lb

## 2016-08-30 DIAGNOSIS — Z86718 Personal history of other venous thrombosis and embolism: Secondary | ICD-10-CM

## 2016-08-30 DIAGNOSIS — M7989 Other specified soft tissue disorders: Secondary | ICD-10-CM

## 2016-08-30 DIAGNOSIS — I82402 Acute embolism and thrombosis of unspecified deep veins of left lower extremity: Secondary | ICD-10-CM | POA: Insufficient documentation

## 2016-08-30 DIAGNOSIS — I82412 Acute embolism and thrombosis of left femoral vein: Secondary | ICD-10-CM

## 2016-08-30 DIAGNOSIS — Z944 Liver transplant status: Secondary | ICD-10-CM | POA: Diagnosis not present

## 2016-08-30 MED ORDER — APIXABAN 5 MG PO TABS: ORAL_TABLET | ORAL | 0 refills | Status: DC

## 2016-08-30 NOTE — Patient Instructions (Signed)
Thank you for coming in today. Start Eliquis 2 pills by mouth twice daily for 7 days.  Switch to 1 pill by mouth twice daily after that.  Return for recheck in 1 week with Dr Lyn HollingsheadAlexander.  Use compression sleeve.  Call or go to the emergency room if you get worse, have trouble breathing, have chest pains, or palpitations.    Deep Vein Thrombosis A deep vein thrombosis (DVT) is a blood clot (thrombus) that usually occurs in a deep, larger vein of the lower leg or the pelvis, or in an upper extremity such as the arm. These are dangerous and can lead to serious and even life-threatening complications if the clot travels to the lungs. A DVT can damage the valves in your leg veins so that instead of flowing upward, the blood pools in the lower leg. This is called post-thrombotic syndrome, and it can result in pain, swelling, discoloration, and sores on the leg. What are the causes? A DVT is caused by the formation of a blood clot in your leg, pelvis, or arm. Usually, several things contribute to the formation of blood clots. A clot may develop when:  Your blood flow slows down.  Your vein becomes damaged in some way.  You have a condition that makes your blood clot more easily. What increases the risk? A DVT is more likely to develop in:  People who are older, especially over 62 years of age.  People who are overweight (obese).  People who sit or lie still for a long time, such as during long-distance travel (over 4 hours), bed rest, hospitalization, or during recovery from certain medical conditions like a stroke.  People who do not engage in much physical activity (sedentary lifestyle).  People who have chronic breathing disorders.  People who have a personal or family history of blood clots or blood clotting disease.  People who have peripheral vascular disease (PVD), diabetes, or some types of cancer.  People who have heart disease, especially if the person had a recent heart attack  or has congestive heart failure.  People who have neurological diseases that affect the legs (leg paresis).  People who have had a traumatic injury, such as breaking a hip or leg.  People who have recently had major or lengthy surgery, especially on the hip, knee, or abdomen.  People who have had a central line placed inside a large vein.  People who take medicines that contain the hormone estrogen. These include birth control pills and hormone replacement therapy.  Pregnancy or during childbirth or the postpartum period.  Long plane flights (over 8 hours). What are the signs or symptoms?   Symptoms of a DVT can include:  Swelling of your leg or arm, especially if one side is much worse.  Warmth and redness of your leg or arm, especially if one side is much worse.  Pain in your arm or leg. If the clot is in your leg, symptoms may be more noticeable or worse when you stand or walk.  A feeling of pins and needles, if the clot is in the arm. The symptoms of a DVT that has traveled to the lungs (pulmonary embolism, PE) usually start suddenly and include:  Shortness of breath while active or at rest.  Coughing or coughing up blood or blood-tinged mucus.  Chest pain that is often worse with deep breaths.  Rapid or irregular heartbeat.  Feeling light-headed or dizzy.  Fainting.  Feeling anxious.  Sweating. There may also be pain  and swelling in a leg if that is where the blood clot started. These symptoms may represent a serious problem that is an emergency. Do not wait to see if the symptoms will go away. Get medical help right away. Call your local emergency services (911 in the [place] Do not drive yourself to the hospital.  How is this diagnosed? Your health care provider will take a medical history and perform a physical exam. You may also have other tests, including:  Blood tests to assess the clotting properties of your blood.  Imaging tests, such as CT, ultrasound,  MRI, X-ray, and other tests to see if you have clots anywhere in your body. How is this treated? After a DVT is identified, it can be treated. The type of treatment that you receive depends on many factors, such as the cause of your DVT, your risk for bleeding or developing more clots, and other medical conditions that you have. Sometimes, a combination of treatments is necessary. Treatment options may be combined and include:  Monitoring the blood clot with ultrasound.  Taking medicines by mouth, such as newer blood thinners (anticoagulants), thrombolytics, or warfarin.  Taking anticoagulant medicine by injection or through an IV tube.  Wearing compression stockings or using different types ofdevices.  Surgery (rare) to remove the blood clot or to place a filter in your abdomen to stop the blood clot from traveling to your lungs. Treatments for a DVT are often divided into immediate treatment and long-term treatment (up to 3 months after DVT). You can work with your health care provider to choose the treatment program that is best for you. Follow these instructions at home: If you are taking a newer oral anticoagulant:  Take the medicine every single day at the same time each day.  Understand what foods and drugs interact with this medicine.  Understand that there are no regular blood tests required when using this medicine.  Understand the side effects of this medicine, including excessive bruising or bleeding. Ask your health care provider or pharmacist about other possible side effects. If you are taking warfarin:  Understand how to take warfarin and know which foods can affect how warfarin works in Public relations account executive.  Understand that it is dangerous to take too much or too little warfarin. Too much warfarin increases the risk of bleeding. Too little warfarin continues to allow the risk for blood clots.  Follow your PT and INR blood testing schedule. The PT and INR results allow your  health care provider to adjust your dose of warfarin. It is very important that you have your PT and INR tested as often as told by your health care provider.  Avoid major changes in your diet, or tell your health care provider before you change your diet. Arrange a visit with a registered dietitian to answer your questions. Many foods, especially foods that are high in vitamin K, can interfere with warfarin and affect the PT and INR results. Eat a consistent amount of foods that are high in vitamin K, such as:  Spinach, kale, broccoli, cabbage, collard greens, turnip greens, Brussels sprouts, peas, cauliflower, seaweed, and parsley.  Beef liver and pork liver.  Green tea.  Soybean oil.  Tell your health care provider about any and all medicines, vitamins, and supplements that you take, including aspirin and other over-the-counter anti-inflammatory medicines. Be especially cautious with aspirin and anti-inflammatory medicines. Do not take those before you ask your health care provider if it is safe to do so.  This is important because many medicines can interfere with warfarin and affect the PT and INR results.  Do not start or stop taking any over-the-counter or prescription medicine unless your health care provider or pharmacist tells you to do so. If you take warfarin, you will also need to do these things:  Hold pressure over cuts for longer than usual.  Tell your dentist and other health care providers that you are taking warfarin before you have any procedures in which bleeding may occur.  Avoid alcohol or drink very small amounts. Tell your health care provider if you change your alcohol intake.  Do not use tobacco products, including cigarettes, chewing tobacco, and e-cigarettes. If you need help quitting, ask your health care provider.  Avoid contact sports. General instructions  Take over-the-counter and prescription medicines only as told by your health care provider.  Anticoagulant medicines can have side effects, including easy bruising and difficulty stopping bleeding. If you are prescribed an anticoagulant, you will also need to do these things:  Hold pressure over cuts for longer than usual.  Tell your dentist and other health care providers that you are taking anticoagulants before you have any procedures in which bleeding may occur.  Avoid contact sports.  Wear a medical alert bracelet or carry a medical alert card that says you have had a PE.  Ask your health care provider how soon you can go back to your normal activities. Stay active to prevent new blood clots from forming.  Make sure to exercise while traveling or when you have been sitting or standing for a long period of time. It is very important to exercise. Exercise your legs by walking or by tightening and relaxing your leg muscles often. Take frequent walks.  Wear compression stockings as told by your health care provider to help prevent more blood clots from forming.  Do not use tobacco products, including cigarettes, chewing tobacco, and e-cigarettes. If you need help quitting, ask your health care provider.  Keep all follow-up appointments with your health care provider. This is important. How is this prevented? Take these actions to decrease your risk of developing another DVT:  Exercise regularly. For at least 30 minutes every day, engage in:  Activity that involves moving your arms and legs.  Activity that encourages good blood flow through your body by increasing your heart rate.  Exercise your arms and legs every hour during long-distance travel (over 4 hours). Drink plenty of water and avoid drinking alcohol while traveling.  Avoid sitting or lying in bed for long periods of time without moving your legs.  Maintain a weight that is appropriate for your height. Ask your health care provider what weight is healthy for you.  If you are a woman who is over 51 years of age,  avoid unnecessary use of medicines that contain estrogen. These include birth control pills.  Do not smoke, especially if you take estrogen medicines. If you need help quitting, ask your health care provider. If you are hospitalized, prevention measures may include:  Early walking after surgery, as soon as your health care provider says that it is safe.  Receiving anticoagulants to prevent blood clots.If you cannot take anticoagulants, other options may be available, such as wearing compression stockings or using different types of devices. Get help right away if:  You have new or increased pain, swelling, or redness in an arm or leg.  You have numbness or tingling in an arm or leg.  You have shortness  of breath while active or at rest.  You have chest pain.  You have a rapid or irregular heartbeat.  You feel light-headed or dizzy.  You cough up blood.  You notice blood in your vomit, bowel movement, or urine. These symptoms may represent a serious problem that is an emergency. Do not wait to see if the symptoms will go away. Get medical help right away. Call your local emergency services (911 in the [place] Do not drive yourself to the hospital.  This information is not intended to replace advice given to you by your health care provider. Make sure you discuss any questions you have with your health care provider. Document Released: 10/02/2005 Document Revised: 03/09/2016 Document Reviewed: 01/27/2015 Elsevier Interactive Patient Education  2017 [employer]

## 2016-08-30 NOTE — Progress Notes (Signed)
Paul Fuentes is a 60 y.o. male who [facility]: Primary Care Sports Medicine today for left leg swelling. Patient has a personal history of least 2 prior DVTs. Additionally he has a significant past surgical history for liver transplant. He was recently seen last week for cough thought to be due to bronchitis and prescribed azithromycin Tessalon cough suppressant. He notes this helped control his cough significantly. However 3 days ago he developed left leg pain and swelling. He notes this is consistent with previous episodes of DVT. He scheduled with me this afternoon for evaluation. I called ahead and obtained an ultrasound of his left leg confirming DVT. He denies any further cough chest pain palpitations or shortness of breath. He feels well otherwise.   Past Medical History:  Diagnosis Date  . 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  History of unprovoked distal left lower extra may DVT in 2000, 6 weeks of enoxaparin.  History of autoimmune hepatitis and liver transplant in 2008, complicated by lower extremity DVT.  History of ulcerative colitis, status post colectomy 2001 Hematology consult 03/2016. Regarding recommendation for treatment of her current pains, patient with multiple DVT, vascular surgery requested second opinion. Hematology noted would be reasonable to recommend anticoagulation but patient was not interested in this. Was encouraged to consider prophylactic anticoagulation in increased risk areas such as surgery. Recommended rivaroxaban 10 mg daily or apixiban 2.5 mg twice a day 3-5 days before a procedure and up to one month after the procedure. Blood pressure 169/113 per records in June 2017  . History of total colectomy 06/29/2016  . Hypertension   . Liver transplant recipient [facility](HCC)  06/29/2016   Past Surgical History:  Procedure Laterality Date  . COLECTOMY  2001  . LIVER TRANSPLANT  05/2007  . REPLACEMENT TOTAL KNEE  09/2011   Social History  Substance Use Topics  . Smoking status: Former Games developermoker  . Smokeless tobacco: Never Used  . Alcohol use No   family history is not on file.  ROS as above:  Medications: Current Outpatient Prescriptions  Medication Sig Dispense Refill  . benzonatate (TESSALON) 200 MG capsule Take 1 capsule (200 mg total) by mouth 3 (three) times daily as needed for cough. 45 capsule 0  . chlorthalidone (HYGROTON) 25 MG tablet Take 1 tablet (25 mg total) by mouth daily. (Can start with 1/2 tablet - 12.5 mg total - for first two weeks) 30 tablet 2  . mycophenolate (CELLCEPT) 500 MG tablet Take 500 mg by mouth daily.    Marland Kitchen. omeprazole (PRILOSEC) 20 MG capsule Take 20 mg by mouth 2 (two) times daily.  0  . predniSONE (DELTASONE) 5 MG tablet Take 5 mg by mouth daily with breakfast.    . tacrolimus (PROGRAF) 1 MG capsule Take 1 mg by mouth 2 (two) times daily.    Marland Kitchen. apixaban (ELIQUIS) 5 MG TABS tablet 2 pills po bid x 7 days then 1 pill bo bid 70 tablet 0   No current facility-administered medications for this visit.    No Known Allergies  Health Maintenance Health Maintenance  Topic Date Due  . ZOSTAVAX  12/15/2013  . COLONOSCOPY  07/13/2026  . TETANUS/TDAP  07/13/2026  . INFLUENZA VACCINE  Completed  . Hepatitis C Screening  Completed  . HIV Screening  Completed     Exam:  BP (!) 140/92  Pulse 97   Ht 5\' 8"  (1.727 m)   Wt 228 lb (103.4 kg)   SpO2 98%   BMI 34.67 kg/m  Gen: Well NAD HEENT: EOMI,  MMM Lungs: Normal work of breathing. CTABL Heart: RRR no MRG Abd: NABS, Soft. Nondistended, Nontender Exts: Left leg swollen and mildly erythematous. Mildly tender to palpation with calf squeeze. Pulses intact distally.   No results found for this or any previous visit (from the past 72 hour(s)). [place] Venous Img Lower Unilateral  Left  Result Date: 08/30/2016 CLINICAL DATA:  Left lower leg swelling for 4 days. EXAM: LEFT LOWER EXTREMITY VENOUS DOPPLER ULTRASOUND TECHNIQUE: Gray-scale sonography with graded compression, as well as color Doppler and duplex ultrasound, were performed to evaluate the deep venous system from the level of the common femoral vein through the popliteal and proximal calf veins. Spectral Doppler was utilized to evaluate flow at rest and with distal augmentation maneuvers. COMPARISON:  None. FINDINGS: Right common femoral vein is patent without thrombus. Positive for deep venous thrombosis in left lower extremity. There is echogenic thrombus in the left common femoral vein, left saphenofemoral junction and left profunda femoral vein. There appears to be nonocclusive thrombus in left femoral vein. Echogenic thrombus in the left popliteal vein. Limited evaluation of the deep left calf veins. Left posterior tibial veins at the ankle are patent. There is thrombus in the left great saphenous vein in the left thigh and knee. There may be a small amount of thrombus in the left great saphenous vein in the ankle region. IMPRESSION: Positive for DVT in the left lower extremity. There is clot extending from the left popliteal vein to the left common femoral vein. Cannot exclude extension into the pelvis. Positive for superficial venous thrombosis in the left great saphenous vein. Electronically Signed   By: Paul OverlieAdam  Henn M.D.   On: 08/30/2016 13:50      Assessment and Plan: [age] y.o. male with DVT. Recurrent. Plan to start Eliquis and recheck next week. Recommending compression stockings. Patient has no signs of pulmonary embolism. I feel that Eliquis is safe as patient has a normally functioning liver after his liver transplant. I think the safety of Eliquis exceeds that of Lovenox or warfarin at this time. Return sooner if needed.   No orders of the defined types were placed in this encounter.   Discussed warning  signs or symptoms. Please see discharge instructions. Patient expresses understanding.  CC: Paul Fuentes Alexander, DO

## 2016-08-30 NOTE — Telephone Encounter (Signed)
Patient has a history of a DVT and has leg swelling. He has an appointment scheduled with me later today for evaluation for DVT. I have ordered a vascular ultrasound to be performed and will see the patient this afternoon. He denies chest pain or palpitations.

## 2016-09-06 ENCOUNTER — Encounter: Payer: Self-pay | Admitting: Osteopathic Medicine

## 2016-09-06 ENCOUNTER — Ambulatory Visit (INDEPENDENT_AMBULATORY_CARE_PROVIDER_SITE_OTHER): Payer: BLUE CROSS/BLUE SHIELD | Admitting: Osteopathic Medicine

## 2016-09-06 VITALS — BP 154/108 | HR 102 | Wt 224.0 lb

## 2016-09-06 DIAGNOSIS — I824Y2 Acute embolism and thrombosis of unspecified deep veins of left proximal lower extremity: Secondary | ICD-10-CM

## 2016-09-06 DIAGNOSIS — I10 Essential (primary) hypertension: Secondary | ICD-10-CM

## 2016-09-06 DIAGNOSIS — R944 Abnormal results of kidney function studies: Secondary | ICD-10-CM

## 2016-09-06 DIAGNOSIS — Z86718 Personal history of other venous thrombosis and embolism: Secondary | ICD-10-CM

## 2016-09-06 MED ORDER — APIXABAN 5 MG PO TABS: 5.0000 mg | ORAL_TABLET | Freq: Two times a day (BID) | ORAL | 3 refills | Status: DC

## 2016-09-06 NOTE — Progress Notes (Signed)
HPI: Paul Fuentes is a 60 y.o. male  who [name] today, 09/06/16,  for chief complaint of:  Chief Complaint  Patient presents with  . f/u DVT    DVT . Context: Recently seen by my colleague Dr. Denyse Amassorey for recurrent DVT. Marland Kitchen. Location: Left lower extremity . Modifying factors: started on the Eliquis last Wednesday, never been on this medicine before.  . Assoc signs/symptoms: No chest pain, no shortness of breath  Hypertension: Patient has been resistant to increasing dose of antihypertensives due to concern for side effects particularly fatigue. No chest pain, pressure, shortness of breath.  Past medical, surgical, social and family history reviewed: Patient Active Problem List   Diagnosis Date Noted  . Left leg DVT (HCC) 08/30/2016  . Cardiac risk counseling 07/13/2016  . 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 Essential hypertension 06/29/2016  . History of DVT (deep vein thrombosis) 06/29/2016  . Liver [facility](HCC) 06/29/2016  . History of total colectomy 06/29/2016   Past Surgical History:  Procedure Laterality Date  . COLECTOMY  2001  . LIVER TRANSPLANT  05/2007  . REPLACEMENT TOTAL KNEE  09/2011   Social History  Substance Use Topics  . Smoking status: Former Games developermoker  . Smokeless tobacco: Never Used  . Alcohol use No   No family history on file.   Current medication list and allergy/intolerance information reviewed:   Current Outpatient Prescriptions on File Prior to Visit  Medication Sig Dispense Refill  . apixaban (ELIQUIS) 5 MG TABS tablet 2 pills po bid x 7 days then 1 pill bo bid 70 tablet 0  . benzonatate (TESSALON) 200 MG capsule Take 1 capsule (200 mg total) by mouth 3 (three) times daily as needed for cough. 45 capsule 0  . chlorthalidone (HYGROTON) 25 MG tablet Take 1 tablet (25 mg total) by mouth daily. (Can start with 1/2 tablet - 12.5 mg total - for first two weeks) 30 tablet 2  . mycophenolate  (CELLCEPT) 500 MG tablet Take 500 mg by mouth daily.    Marland Kitchen. omeprazole (PRILOSEC) 20 MG capsule Take 20 mg by mouth 2 (two) times daily.  0  . predniSONE (DELTASONE) 5 MG tablet Take 5 mg by mouth daily with breakfast.    . tacrolimus (PROGRAF) 1 MG capsule Take 1 mg by mouth 2 (two) times daily.     No current facility-administered medications on file prior to visit.    No Known Allergies    Review of Systems:  Constitutional: No recent illness  HEENT: No  headache, no vision change  Cardiac: No  chest pain, No  pressure, No palpitations   Respiratory:  No  shortness of breath. No  Cough  Neurologic: No  weakness, No  Dizziness   Exam:  BP (!) 154/108   Pulse (!) 102   Wt 224 lb (101.6 kg)   BMI 34.06 kg/m   Constitutional: VS see above. General Appearance: alert, well-developed, well-nourished, NAD  Eyes: Normal lids and conjunctive, non-icteric sclera  Ears, Nose, Mouth, Throat: MMM, Normal external inspection ears/nares/mouth/lips/gums.  Neck: No masses, trachea midline.   Respiratory: Normal respiratory effort. no wheeze, no rhonchi, no rales  Cardiovascular: S1/S2 normal, no murmur, no rub/gallop auscultated. RRR. +LE edema on L  Musculoskeletal: Gait normal. Symmetric and independent movement of all extremities.   Neurological: Normal balance/coordination. No tremor.  Skin: warm, dry, intact.   Psychiatric: Normal judgment/insight. Normal mood and affect. Oriented x3.  ASSESSMENT/PLAN: Patient advised that he will need lifelong anticoagulation. Doing well on twice a day dosing of L Quist, planning to continue this. Repeat renal function as below.  Acute deep vein thrombosis (DVT) of proximal vein of left lower extremity (HCC) - Plan: COMPLETE METABOLIC PANEL WITH GFR  Essential hypertension - Plan: COMPLETE METABOLIC PANEL WITH GFR     Visit summary with medication list and pertinent instructions was printed for patient to review. All questions at  time of visit were answered - patient instructed to contact office with any additional concerns. ER/RTC precautions were reviewed with the patient. Follow-up plan: Return in about 3 months (around 12/07/2016) for BLOOD PRESSURE RECHECK .

## 2016-09-07 LAB — COMPLETE METABOLIC PANEL WITH GFR
ALBUMIN: 4 g/dL (ref 3.6–5.1)
ALK PHOS: 88 U/L (ref 40–115)
ALT: 11 U/L (ref 9–46)
AST: 16 U/L (ref 10–35)
BILIRUBIN TOTAL: 0.5 mg/dL (ref 0.2–1.2)
BUN: 28 mg/dL — ABNORMAL HIGH (ref 7–25)
CALCIUM: 9.2 mg/dL (ref 8.6–10.3)
CHLORIDE: 102 mmol/L (ref 98–110)
CO2: 23 mmol/L (ref 20–31)
CREATININE: 1.66 mg/dL — AB (ref 0.70–1.25)
GFR, EST AFRICAN AMERICAN: 50 mL/min — AB (ref >=60)
GFR, Est Non African American: 44 mL/min — ABNORMAL LOW (ref >=60)
Glucose, Bld: 94 mg/dL (ref 65–99)
Potassium: 4 mmol/L (ref 3.5–5.3)
Sodium: 138 mmol/L (ref 135–146)
Total Protein: 8 g/dL (ref 6.1–8.1)

## 2016-09-12 NOTE — Addendum Note (Signed)
Addended by: Deirdre PippinsALEXANDER, Luticia Tadros M on: 09/12/2016 07:05 AM   Modules accepted: Orders

## 2016-09-14 ENCOUNTER — Ambulatory Visit: Payer: BLUE CROSS/BLUE SHIELD | Admitting: Osteopathic Medicine

## 2016-09-25 ENCOUNTER — Other Ambulatory Visit: Payer: Self-pay | Admitting: Family Medicine

## 2016-09-26 ENCOUNTER — Other Ambulatory Visit: Payer: Self-pay

## 2016-09-26 ENCOUNTER — Other Ambulatory Visit: Payer: Self-pay | Admitting: *Deleted

## 2016-09-26 DIAGNOSIS — I10 Essential (primary) hypertension: Secondary | ICD-10-CM

## 2016-09-26 MED ORDER — CHLORTHALIDONE 25 MG PO TABS
25.0000 mg | ORAL_TABLET | Freq: Every day | ORAL | 0 refills | Status: DC
Start: 2016-09-26 — End: 2016-12-11

## 2016-09-26 MED ORDER — APIXABAN 5 MG PO TABS: 5.0000 mg | ORAL_TABLET | Freq: Two times a day (BID) | ORAL | 3 refills | Status: DC

## 2016-12-07 ENCOUNTER — Ambulatory Visit: Payer: BLUE CROSS/BLUE SHIELD | Admitting: Osteopathic Medicine

## 2016-12-11 ENCOUNTER — Encounter: Payer: Self-pay | Admitting: Osteopathic Medicine

## 2016-12-11 ENCOUNTER — Ambulatory Visit (INDEPENDENT_AMBULATORY_CARE_PROVIDER_SITE_OTHER): Payer: BLUE CROSS/BLUE SHIELD | Admitting: Osteopathic Medicine

## 2016-12-11 VITALS — BP 186/119 | HR 88 | Ht 68.0 in | Wt 234.0 lb

## 2016-12-11 DIAGNOSIS — I82512 Chronic embolism and thrombosis of left femoral vein: Secondary | ICD-10-CM

## 2016-12-11 DIAGNOSIS — I10 Essential (primary) hypertension: Secondary | ICD-10-CM | POA: Diagnosis not present

## 2016-12-11 MED ORDER — AMLODIPINE BESY-BENAZEPRIL HCL 5-20 MG PO CAPS: 1.0000 | ORAL_CAPSULE | Freq: Every day | ORAL | 1 refills | Status: DC

## 2016-12-11 NOTE — Progress Notes (Signed)
HPI: Paul Fuentes is a 61 y.o. male  who [name] [facility], 12/11/16,  for chief complaint of:  Chief Complaint  Patient presents with  . Follow-up    BLOOD PRESSURE     . HTN: stopped Chlorthalidone d/t concern for SOB, SOB is better since stopping but BP elevated now. Has been resistant to BP meds d/t side effects, particularly fatigue. No orthopnea or unusual LE edema.    DVT: doing well on Eliquis, no CP. SOB seems to have been an issue but pt reports is better at the moment.    Past medical history, surgical history, social history and family history reviewed.  Patient Active Problem List   Diagnosis Date Noted  . 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 Left leg DVT (HCC) 08/30/2016  . Cardiac risk counseling 07/13/2016  . 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 Essential hypertension 06/29/2016  . History of DVT (deep vein thrombosis) 06/29/2016  . Liver transplant recipient [facility](HCC) 06/29/2016  . History of total colectomy 06/29/2016    Current medication list and allergy/intolerance information reviewed.   Current Outpatient Prescriptions on File Prior to Visit  Medication Sig Dispense Refill  . apixaban (ELIQUIS) 5 MG TABS tablet Take 1 tablet (5 mg total) by mouth 2 (two) times daily. 180 tablet 3  . mycophenolate (CELLCEPT) 500 MG tablet Take 500 mg by mouth daily.    Marland Kitchen. omeprazole (PRILOSEC) 20 MG capsule Take 20 mg by mouth 2 (two) times daily.  0  . predniSONE (DELTASONE) 5 MG tablet Take 5 mg by mouth daily with breakfast.    . tacrolimus (PROGRAF) 1 MG capsule Take 1 mg by mouth 2 (two) times daily.    . chlorthalidone (HYGROTON) 25 MG tablet Take 1 tablet (25 mg total) by mouth daily. (Can start with 1/2 tablet - 12.5 mg total - for first two weeks) (Patient not taking: Reported on 12/11/2016) 90 tablet 0   No current facility-administered medications on file prior to visit.    No Known Allergies     Review of Systems:  Constitutional: No recent illness  HEENT: No  headache, no vision change  Cardiac: No  chest pain, No  pressure, No palpitations  Respiratory:  +improving shortness of breath. No  Cough  Gastrointestinal: No  abdominal pain  Musculoskeletal: No new myalgia/arthralgia  Skin: No  Rash  Neurologic: No  weakness, No  Dizziness   Exam:  BP (!) 186/119   Pulse 88   Ht 5\' 8"  (1.727 m)   Wt 234 lb (106.1 kg)   BMI 35.58 kg/m   Constitutional: VS see above. General Appearance: alert, well-developed, well-nourished, NAD  Eyes: Normal lids and conjunctive, non-icteric sclera  Ears, Nose, Mouth, Throat: MMM, Normal external inspection ears/nares/mouth/lips/gums.  Neck: No masses, trachea midline.   Respiratory: Normal respiratory effort. no wheeze, no rhonchi, no rales  Cardiovascular: S1/S2 normal, no murmur, no rub/gallop auscultated. RRR.   Musculoskeletal: Gait normal. Symmetric and independent movement of all extremities  Neurological: Normal balance/coordination. No tremor.  Skin: warm, dry, intact.   Psychiatric: Normal judgment/insight. Normal mood and affect. Oriented x3.     ASSESSMENT/PLAN:   Trial combo medication.   Caution w/ renal fxn, will check BMP  Essential hypertension - Plan: amLODipine-benazepril (LOTREL) 5-20 MG capsule  Chronic deep vein thrombosis (DVT) of femoral vein of left lower extremity (HCC)       Follow-up plan: Return in about 2 weeks (  around 12/25/2016).  Visit summary with medication list and pertinent instructions was printed for patient to review, alert [place] if any changes needed. All questions at time of visit were answered - patient instructed to contact office with any additional concerns. ER/RTC precautions were reviewed with the patient and understanding verbalized.   Note: Total time spent 15 minutes, greater than 50% of the visit was spent face-to-face counseling and coordinating care for the  following: The primary encounter diagnosis was Essential hypertension. A diagnosis of Chronic deep vein thrombosis (DVT) of femoral vein of left lower extremity (HCC) was also pertinent to this visit.Marland Kitchen

## 2016-12-12 LAB — BASIC METABOLIC PANEL WITH GFR
BUN: 18 mg/dL (ref 7–25)
CALCIUM: 9 mg/dL (ref 8.6–10.3)
CO2: 25 mmol/L (ref 20–31)
Chloride: 105 mmol/L (ref 98–110)
Creat: 1.2 mg/dL (ref 0.70–1.25)
GFR, Est African American: 74 mL/min (ref >=60)
GFR, Est Non African American: 64 mL/min (ref >=60)
Glucose, Bld: 104 mg/dL — ABNORMAL HIGH (ref 65–99)
Potassium: 4.1 mmol/L (ref 3.5–5.3)
Sodium: 139 mmol/L (ref 135–146)

## 2016-12-25 ENCOUNTER — Ambulatory Visit (INDEPENDENT_AMBULATORY_CARE_PROVIDER_SITE_OTHER): Payer: BLUE CROSS/BLUE SHIELD | Admitting: Osteopathic Medicine

## 2016-12-25 ENCOUNTER — Encounter: Payer: Self-pay | Admitting: Osteopathic Medicine

## 2016-12-25 VITALS — BP 145/95 | HR 85 | Ht 68.0 in | Wt 235.0 lb

## 2016-12-25 DIAGNOSIS — I10 Essential (primary) hypertension: Secondary | ICD-10-CM | POA: Diagnosis not present

## 2016-12-25 DIAGNOSIS — I82512 Chronic embolism and thrombosis of left femoral vein: Secondary | ICD-10-CM | POA: Diagnosis not present

## 2016-12-25 MED ORDER — AMLODIPINE BESY-BENAZEPRIL HCL 5-20 MG PO CAPS: 1.0000 | ORAL_CAPSULE | Freq: Every day | ORAL | 1 refills | Status: DC

## 2016-12-25 NOTE — Progress Notes (Signed)
HPI: Paul Fuentes is a 61 y.o. male  who presents to [name] today, 12/25/16,  for chief complaint of:  Chief Complaint  Patient presents with  . Follow-up    DVT    BP: 186/119 last viist, was off meds at that point. We started amlodipine-benazepril combo. He feels well on this medication, no dizziness or fatigue. Does not want to increase dose at this time, though we discussed that he is not quite at goal but the BP is much better than it was.   DVT: stable on eliquis, hx multiple DVT, pt aware treatment will be indefinite     Past medical, surgical, social and family history reviewed: Patient Active Problem List   Diagnosis Date Noted  . 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 Left leg DVT (HCC) 08/30/2016  . Cardiac risk counseling 07/13/2016  . 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 Essential hypertension 06/29/2016  . History of DVT (deep vein thrombosis) 06/29/2016  . Liver transplant recipient [facility]) 06/29/2016  . History of total colectomy 06/29/2016   Past Surgical History:  Procedure Laterality Date  . COLECTOMY  2001  . LIVER TRANSPLANT  05/2007  . REPLACEMENT TOTAL KNEE  09/2011   Social History  Substance Use Topics  . Smoking status: Former Games developer  . Smokeless tobacco: Never Used  . Alcohol use No   History reviewed. No pertinent family history.   Current medication list and allergy/intolerance information reviewed:   Current Outpatient Prescriptions  Medication Sig Dispense Refill  . amLODipine-benazepril (LOTREL) 5-20 MG capsule Take 1 capsule by mouth daily. 30 capsule 1  . apixaban (ELIQUIS) 5 MG TABS tablet Take 1 tablet (5 mg total) by mouth 2 (two) times daily. 180 tablet 3  . mycophenolate (CELLCEPT) 500 MG tablet Take 500 mg by mouth daily.    Marland Kitchen omeprazole (PRILOSEC) 20 MG capsule Take 20 mg by mouth 2 (two) times daily.  0  . predniSONE (DELTASONE) 5 MG tablet Take 5 mg by mouth daily with  breakfast.    . tacrolimus (PROGRAF) 1 MG capsule Take 1 mg by mouth 2 (two) times daily.     No current facility-administered medications for this visit.    No Known Allergies    Review of Systems:  Constitutional:  No  fever, no chills, No recent illness, No unintentional weight changes. No significant fatigue.   Cardiac: No  chest pain, No  pressure, No palpitations  Respiratory:  No  shortness of breath. No  Cough  Gastrointestinal: No  abdominal pain, No  nausea  Neurologic: No  weakness, No  dizziness  Exam:  BP (!) 145/95   Pulse 85   Ht 5\' 8"  (1.727 m)   Wt 235 lb (106.6 kg)   BMI 35.73 kg/m   Constitutional: VS see above. General Appearance: alert, well-developed, well-nourished, NAD  Respiratory: Normal respiratory effort. no wheeze, no rhonchi, no rales  Cardiovascular: S1/S2 normal, no murmur, no rub/gallop auscultated. RRR.   Skin: warm, dry, intact. No rash/ulcer. No concerning nevi or subq nodules on limited exam.    Psychiatric: Normal judgment/insight. Normal mood and affect. Oriented x3.    ASSESSMENT/PLAN:   Essential hypertension - Plan: Basic metabolic panel, amLODipine-benazepril (LOTREL) 5-20 MG capsule  Chronic deep vein thrombosis (DVT) of femoral vein of left lower extremity (HCC)     Visit summary with medication list and pertinent instructions was printed for patient to review. All questions at time of  visit were answered - patient instructed to contact office with any additional concerns. ER/RTC precautions were reviewed with the patient. Follow-up plan: Return in about 4 months (around 04/26/2017) for blood pressure follow-up, [place] if needed .

## 2017-03-27 ENCOUNTER — Ambulatory Visit (INDEPENDENT_AMBULATORY_CARE_PROVIDER_SITE_OTHER): Payer: BLUE CROSS/BLUE SHIELD | Admitting: Osteopathic Medicine

## 2017-03-27 ENCOUNTER — Encounter: Payer: Self-pay | Admitting: Osteopathic Medicine

## 2017-03-27 ENCOUNTER — Ambulatory Visit (INDEPENDENT_AMBULATORY_CARE_PROVIDER_SITE_OTHER): Payer: BLUE CROSS/BLUE SHIELD

## 2017-03-27 VITALS — BP 149/93 | HR 80 | Wt 236.0 lb

## 2017-03-27 DIAGNOSIS — R0600 Dyspnea, unspecified: Secondary | ICD-10-CM | POA: Insufficient documentation

## 2017-03-27 DIAGNOSIS — R0609 Other forms of dyspnea: Secondary | ICD-10-CM

## 2017-03-27 DIAGNOSIS — J9811 Atelectasis: Secondary | ICD-10-CM

## 2017-03-27 DIAGNOSIS — I10 Essential (primary) hypertension: Secondary | ICD-10-CM | POA: Diagnosis not present

## 2017-03-27 LAB — CBC
HEMATOCRIT: 40.5 % (ref 38.5–50.0)
HEMOGLOBIN: 13.4 g/dL (ref 13.2–17.1)
MCH: 29.7 pg (ref 27.0–33.0)
MCHC: 33.1 g/dL (ref 32.0–36.0)
MCV: 89.8 fL (ref 80.0–100.0)
MPV: 10.7 fL (ref 7.5–12.5)
Platelets: 252 10*3/uL (ref 140–400)
RBC: 4.51 MIL/uL (ref 4.20–5.80)
RDW: 14.4 % (ref 11.0–15.0)
WBC: 14.4 10*3/uL — AB (ref 3.8–10.8)

## 2017-03-27 NOTE — Patient Instructions (Addendum)
Plan:  For shortness of breath, let's get labs and chest Xray today for completeness.   Your symptoms may be due to a simple explanation such as deconditioning (fancy words for "out of shape") but we want to make sure nothing is going on with the heart or lungs.   Depending on what labs and Xray show, we may consider follow-up with a heart specialist since you are at fairly low risk of lung problems like COPD, I'm more concerned about ruling out a heart issue.   If your symptoms worsen or change, please seek medical attention ASAP!

## 2017-03-27 NOTE — Progress Notes (Signed)
HPI: Paul Fuentes is a 61 y.o. male  who [name] today, 03/27/17,  for chief complaint of:  Chief Complaint  Patient presents with  . f/u blood pressure    HTN: doing well on current regimen, has been reluctant to go up on dose of antihypertensives d/t history of side effects, overall though marked improvement from initial BP which was 180s systolic.   SOB on exertion: bothering him a bit more over the past few months. No orthopnea, no unusual LE sweling, chronic LLE mild swelling on and off w/ hx DVT. Quit smoking >30 years ago.    Past medical history, surgical history, social history and family history reviewed.  Patient Active Problem List   Diagnosis Date Noted  . 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 Left leg DVT (HCC) 08/30/2016  . Cardiac risk counseling 07/13/2016  . 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 Essential hypertension 06/29/2016  . History of DVT (deep vein thrombosis) 06/29/2016  . Liver transplant recipient [facility](HCC) 06/29/2016  . History of total colectomy 06/29/2016    Current medication list and allergy/intolerance information reviewed.   Current Outpatient Prescriptions on File Prior to Visit  Medication Sig Dispense Refill  . amLODipine-benazepril (LOTREL) 5-20 MG capsule Take 1 capsule by mouth daily. 90 capsule 1  . apixaban (ELIQUIS) 5 MG TABS tablet Take 1 tablet (5 mg total) by mouth 2 (two) times daily. 180 tablet 3  . mycophenolate (CELLCEPT) 500 MG tablet Take 500 mg by mouth daily.    Marland Kitchen. omeprazole (PRILOSEC) 20 MG capsule Take 20 mg by mouth 2 (two) times daily.  0  . predniSONE (DELTASONE) 5 MG tablet Take 5 mg by mouth daily with breakfast.    . tacrolimus (PROGRAF) 1 MG capsule Take 1 mg by mouth 2 (two) times daily.     No current facility-administered medications on file prior to visit.    No Known Allergies    Review of Systems:  Constitutional: No recent  illness  HEENT: No  headache, no vision change  Cardiac: No  chest pain, No  pressure, No palpitations  Respiratory:  +shortness of breath. No  Cough  Gastrointestinal: No  abdominal pain, no change on bowel habits  Musculoskeletal: No new myalgia/arthralgia, +chronic knee pain   Neurologic: No  weakness, No  Dizziness  Psychiatric: No  concerns with depression, No  concerns with anxiety  Exam:  BP (!) 149/93   Pulse 80   Wt 236 lb (107 kg)   BMI 35.88 kg/m   Constitutional: VS see above. General Appearance: alert, well-developed, well-nourished, NAD  Eyes: Normal lids and conjunctive, non-icteric sclera  Ears, Nose, Mouth, Throat: MMM, Normal external inspection ears/nares/mouth/lips/gums.  Neck: No masses, trachea midline.   Respiratory: Normal respiratory effort. no wheeze, no rhonchi, no rales  Cardiovascular: S1/S2 normal, no murmur, no rub/gallop auscultated. RRR. No JVD.   Musculoskeletal: Gait normal. Symmetric and independent movement of all extremities  Neurological: Normal balance/coordination. No tremor.  Skin: warm, dry, intact.   Psychiatric: Normal judgment/insight. Normal mood and affect. Oriented x3.    EKG interpretation: Rate: 70 Rhythm: sinus No previous EKG available for comparison  No ST/T changes concerning for acute ischemia/infarct   ASSESSMENT/PLAN:   Dyspnea on exertion - Plan: CBC, COMPLETE METABOLIC PANEL WITH GFR, TSH, B Nat Peptide, DG Chest 2 View  Essential hypertension - Plan: CBC, COMPLETE METABOLIC PANEL WITH GFR, TSH, B Nat Peptide  Patient Instructions  Plan:  For shortness of breath, let's get labs and chest Xray today for completeness.   Your symptoms may be due to a simple explanation such as deconditioning (fancy words for "out of shape") but we want to make sure nothing is going on with the heart or lungs.   Depending on what labs and Xray show, we may consider follow-up with a heart specialist since you are  at fairly low risk of lung problems like COPD, I'm more concerned about ruling out a heart issue.   If your symptoms worsen or change, please seek medical attention ASAP!     Follow-up plan: Return for recheck to be determined by test results & possible referral - due for annual checkup in the fall .  Visit summary with medication list and pertinent instructions was printed for patient to review, alert [place] if any changes needed. All questions at time of visit were answered - patient instructed to contact office with any additional concerns. ER/RTC precautions were reviewed with the patient and understanding verbalized.

## 2017-03-28 ENCOUNTER — Encounter: Payer: Self-pay | Admitting: Family Medicine

## 2017-03-28 ENCOUNTER — Ambulatory Visit (INDEPENDENT_AMBULATORY_CARE_PROVIDER_SITE_OTHER): Payer: BLUE CROSS/BLUE SHIELD | Admitting: Family Medicine

## 2017-03-28 ENCOUNTER — Ambulatory Visit (INDEPENDENT_AMBULATORY_CARE_PROVIDER_SITE_OTHER): Payer: BLUE CROSS/BLUE SHIELD

## 2017-03-28 ENCOUNTER — Telehealth: Payer: Self-pay | Admitting: Family Medicine

## 2017-03-28 DIAGNOSIS — M1711 Unilateral primary osteoarthritis, right knee: Secondary | ICD-10-CM | POA: Diagnosis not present

## 2017-03-28 DIAGNOSIS — Z96652 Presence of left artificial knee joint: Secondary | ICD-10-CM | POA: Diagnosis not present

## 2017-03-28 DIAGNOSIS — G8929 Other chronic pain: Secondary | ICD-10-CM

## 2017-03-28 DIAGNOSIS — M25561 Pain in right knee: Secondary | ICD-10-CM | POA: Diagnosis not present

## 2017-03-28 LAB — COMPLETE METABOLIC PANEL WITH GFR
ALBUMIN: 4 g/dL (ref 3.6–5.1)
ALK PHOS: 73 U/L (ref 40–115)
ALT: 17 U/L (ref 9–46)
AST: 15 U/L (ref 10–35)
BILIRUBIN TOTAL: 0.4 mg/dL (ref 0.2–1.2)
BUN: 16 mg/dL (ref 7–25)
CO2: 23 mmol/L (ref 20–31)
Calcium: 9.1 mg/dL (ref 8.6–10.3)
Chloride: 103 mmol/L (ref 98–110)
Creat: 1.12 mg/dL (ref 0.70–1.25)
GFR, EST AFRICAN AMERICAN: 80 mL/min (ref >=60)
GFR, EST NON AFRICAN AMERICAN: 70 mL/min (ref >=60)
GLUCOSE: 96 mg/dL (ref 65–99)
Potassium: 4.9 mmol/L (ref 3.5–5.3)
SODIUM: 135 mmol/L (ref 135–146)
TOTAL PROTEIN: 7.6 g/dL (ref 6.1–8.1)

## 2017-03-28 LAB — BRAIN NATRIURETIC PEPTIDE: Brain Natriuretic Peptide: 76.4 pg/mL (ref <100)

## 2017-03-28 LAB — TSH: TSH: 3 mIU/L (ref 0.40–4.50)

## 2017-03-28 NOTE — Addendum Note (Signed)
Addended by: Deirdre PippinsALEXANDER, Fielding Mault M on: 03/28/2017 03:22 PM   Modules accepted: Orders

## 2017-03-28 NOTE — Progress Notes (Signed)
   Subjective:    I'm seeing this patient as a consultation for:  Sunnie NielsenAlexander, Natalie, DO   CC: Right knee pain.  HPI: Patient is a pertinent medical history for left total knee replacement as well as right-sided DJD of the knee. He is also status post liver transplant and total colectomy due to ulcerative colitis. He notes that he's been having right-sided knee pain for several years. He's had steroid injections with mixed results in the past. He was told that at some point he would need a knee replacement on the right side as well. He is already tried diclofenac gel which has not helped much at all. He denies fevers or chills. He notes chronic daily knee pain worse with activity better with rest.  Past medical history, Surgical history, Family history not pertinant except as noted below, Social history, Allergies, and medications have been entered into the medical record, reviewed, and no changes needed.   Review of Systems: No headache, visual changes, nausea, vomiting, diarrhea, constipation, dizziness, abdominal pain, skin rash, fevers, chills, night sweats, weight loss, swollen lymph nodes, body aches, joint swelling, muscle aches, chest pain, shortness of breath, mood changes, visual or auditory hallucinations.   Objective:    Vitals:   03/28/17 1522  BP: 131/70  Pulse: 83   General: Well Developed, well nourished, and in no acute distress.  Neuro/Psych: Alert and oriented x3, extra-ocular muscles intact, able to move all 4 extremities, sensation grossly intact. Skin: Warm and dry, no rashes noted.  Respiratory: Not using accessory muscles, speaking in full sentences, trachea midline.  Cardiovascular: Pulses palpable, no extremity edema. Abdomen: Does not appear distended. MSK: Right knee mild effusion otherwise normal. Range of motion 0-120 with 1+ retropatellar crepitations. Stable ligamentous exam. Intact flexion and extension strength.     Impression and Recommendations:     Assessment and Plan: 61 y.o. male with  Right knee pain very likely due to DJD. X-ray pending. Patient has failed some conservative management. I think is reasonable to proceed with Hyaluronic acid injections. We'll authorize Orthovisc. If not we'll continue steroid shots intermittently.   Orders Placed This Encounter  Procedures  . DG Knee Complete 4 Views Right    Please include patellar sunrise, lateral, and weightbearing bilateral AP and bilateral rosenberg views    Standing Status:   Future    Standing Expiration Date:   05/28/2018    Order Specific Question:   Reason for exam:    Answer:   Please include patellar sunrise, lateral, and weightbearing bilateral AP and bilateral rosenberg views    Comments:   Please include patellar sunrise, lateral, and weightbearing bilateral AP and bilateral rosenberg views    Order Specific Question:   Preferred imaging location?    Answer[facility]  . DG Knee 1-2 Views Left    Standing Status:   Future    Standing Expiration Date:   05/29/2018    Order Specific Question:   Reason for Exam (SYMPTOM  OR DIAGNOSIS REQUIRED)    Answer:   For use with right knee x-ray, bilateral AP and Rosenberg standing.    Order Specific Question:   Preferred imaging location?    Answer:   [facility]   No orders of the defined types were placed in this encounter.   Discussed warning signs or symptoms. Please see discharge instructions. Patient expresses understanding.

## 2017-03-28 NOTE — Telephone Encounter (Signed)
Submitted for approval on Orthovisc. Awaiting confirmation.  

## 2017-03-28 NOTE — Patient Instructions (Signed)
Thank you for coming in today. We will do an xray today.  We will authorize the gel shots.  Work on weight loss and quad strength.  Return for injections as needed.

## 2017-03-28 NOTE — Telephone Encounter (Signed)
-----   Message from Rodolph BongEvan S Corey, MD sent at 03/28/2017  3:34 PM EDT ----- Please prior Lissa Moralesauth orthovisc

## 2017-04-02 NOTE — Addendum Note (Signed)
Addended by: Collie SiadICHARDSON, Iliyah Bui M on: 04/02/2017 04:20 PM   Modules accepted: Orders

## 2017-06-06 ENCOUNTER — Encounter: Payer: Self-pay | Admitting: Cardiology

## 2017-06-21 MED ORDER — SODIUM HYALURONATE (VISCOSUP) 20 MG/2ML IX SOSY: PREFILLED_SYRINGE | INTRA_ARTICULAR | 0 refills | Status: DC

## 2017-06-21 NOTE — Telephone Encounter (Signed)
Received the following information from OV benefits investigation:   Orthovisc is covered. Buy and [employer] is allowed. The deductible does not apply. The patient has 0% coinsurance responsibility. Co-pay applies for the administration in the amount of $50. Once the out-of-pocket maximum is met [phone] met) the co-pay will be waived. Prior Authorization is required. The product can be obtained through [employer].# [phone]. Call reference # [id number] Prior Authorization can be initiated by calling [employer] PA Dep  Pt advised. He is OK to get Euflexxa [employer] preferred). Rx to be sent to [facility]. PA form completed and faxed to [facility].

## 2017-06-21 NOTE — Addendum Note (Signed)
Addended by: Collie SiadICHARDSON, Stockton Nunley M on: 06/21/2017 02:30 PM   Modules accepted: Orders

## 2017-06-22 NOTE — Progress Notes (Signed)
Referring- Paul NielsenNatalie Alexander DO Reason for referral-Dyspnea  HPI: 61 year old male for evaluation of dyspnea at request of Paul Nielsenatalie Alexander DO. Venous Dopplers November 2017 showed LLE DVT. Chest x-ray June 2018 showed mild left basilar atelectasis. BNP June 2018 76. TSH, hemoglobin, liver functions and potassium normal. Patient has noticed increasing dyspnea on exertion since his DVT was diagnosed in November 2017. There is no orthopnea, PND, pedal edema, chest pain, palpitations or syncope. No fevers, chills, productive cough or hemoptysis. Because of the above we were asked to evaluate.  Current Outpatient Prescriptions  Medication Sig Dispense Refill  . amLODipine-benazepril (LOTREL) 5-20 MG capsule Take 1 capsule by mouth daily. 90 capsule 1  . apixaban (ELIQUIS) 5 MG TABS tablet Take 1 tablet (5 mg total) by mouth 2 (two) times daily. 180 tablet 3  . mycophenolate (CELLCEPT) 500 MG tablet Take 500 mg by mouth daily.    Marland Kitchen. omeprazole (PRILOSEC) 20 MG capsule Take 20 mg by mouth 2 (two) times daily.  0  . predniSONE (DELTASONE) 5 MG tablet Take 5 mg by mouth daily with breakfast.    . Sodium Hyaluronate (EUFLEXXA) 20 MG/2ML SOSY Inject interarticularly into right knee weekly for 5 weeks. Dx: M17.11 5 Syringe 0  . tacrolimus (PROGRAF) 1 MG capsule Take 1 mg by mouth 2 (two) times daily.     No current facility-administered medications for this visit.     No Known Allergies   Past Medical History:  Diagnosis Date  . 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  History of unprovoked distal left lower extra may DVT in 2000, 6 weeks of enoxaparin.  History of autoimmune hepatitis and liver transplant in 2008, complicated by lower extremity DVT.  History of ulcerative colitis, status post colectomy 2001 Hematology consult 03/2016. Regarding recommendation for treatment of her current pains,  patient with multiple DVT, vascular surgery requested second opinion. Hematology noted would be reasonable to recommend anticoagulation but patient was not interested in this. Was encouraged to consider prophylactic anticoagulation in increased risk areas such as surgery. Recommended rivaroxaban 10 mg daily or apixiban 2.5 mg twice a day 3-5 days before a procedure and up to one month after the procedure. Blood pressure 169/113 per records in June 2017  . Hypertension   . Liver transplant recipient [facility](HCC) 06/29/2016  . Ulcerative colitis [facility](HCC)     Past Surgical History:  Procedure Laterality Date  . APPENDECTOMY    . COLECTOMY  2001  . LIVER TRANSPLANT  05/2007  . REPLACEMENT TOTAL KNEE  09/2011    Social History   Social History  . Marital status: Divorced    Spouse name: N/A  . Number of children: 2  . Years of education: N/A   Occupational History  . Not on file.   Social History Main Topics  . Smoking status: Former Games developermoker  . Smokeless tobacco: Never Used  . Alcohol use No     Comment: Occasional  . Drug use: No  . Sexual activity: Not on file   Other Topics Concern  . Not on file   Social History Narrative  . No narrative on file    Family History  Problem Relation Age of Onset  . Ulcerative colitis Mother   . Colon cancer Father     ROS: Knee arthralgias but no fevers or chills, productive cough, hemoptysis, dysphasia, odynophagia, melena, hematochezia, dysuria, hematuria, rash,  seizure activity, orthopnea, PND, pedal edema, claudication. Remaining systems are negative.  Physical Exam:   Blood pressure 133/88, pulse 68, height  (1.727 m), weight 103.9 kg (229 lb).  General:  Well developed/well nourished in NAD Skin warm/dry Patient not depressed No peripheral clubbing Back-normal HEENT-normal/normal eyelids Neck supple/normal carotid upstroke bilaterally; no bruits; no JVD; no thyromegaly chest - CTA/ normal expansion CV - RRR/normal S1 and S2; no  murmurs, rubs or gallops;  PMI nondisplaced; distant heart sounds. Abdomen -NT/ND, no HSM, no mass, + bowel sounds, no bruit, scar from liver transplant. 2+ femoral pulses, no bruits Ext-no edema, chords, 2+ DP Neuro-grossly nonfocal  ECG - Sinus rhythm at a rate of 68. No ST changes. personally reviewed  A/P  1 dyspnea-etiology unclear. Temporarily he states it began at the time his DVT was diagnosed. I'm concerned that he may have had a pulmonary embolus at the time. Continue apixaban. I will arrange a VQ scan to screen for chronic pulmonary emboli. We will arrange an echocardiogram to assess LV function. He is not volume overloaded on examination. Finally I will arrange a Lexiscan nuclear study to screen for coronary disease. Note he is not having cough or hemoptysis. If above negative patient may require pulmonary evaluation.  2 hypertension-blood pressure is controlled. Continue present medications.  3 prior liver transplant-continue present medications. Management per gastroenterology.  Olga Millers, MD

## 2017-06-27 ENCOUNTER — Ambulatory Visit (INDEPENDENT_AMBULATORY_CARE_PROVIDER_SITE_OTHER): Payer: BLUE CROSS/BLUE SHIELD | Admitting: Cardiology

## 2017-06-27 ENCOUNTER — Other Ambulatory Visit: Payer: Self-pay

## 2017-06-27 ENCOUNTER — Encounter: Payer: Self-pay | Admitting: Cardiology

## 2017-06-27 VITALS — BP 133/88 | HR 68 | Ht 68.0 in | Wt 229.0 lb

## 2017-06-27 DIAGNOSIS — R0602 Shortness of breath: Secondary | ICD-10-CM

## 2017-06-27 DIAGNOSIS — I10 Essential (primary) hypertension: Secondary | ICD-10-CM

## 2017-06-27 DIAGNOSIS — M1711 Unilateral primary osteoarthritis, right knee: Secondary | ICD-10-CM

## 2017-06-27 MED ORDER — SODIUM HYALURONATE (VISCOSUP) 20 MG/2ML IX SOSY: PREFILLED_SYRINGE | INTRA_ARTICULAR | 0 refills | Status: DC

## 2017-06-27 NOTE — Progress Notes (Signed)
Had to print for PA form.

## 2017-06-27 NOTE — Patient Instructions (Signed)
Medication Instructions:   NO CHANGE  Testing/Procedures:  [facility] SCAN AT [facility]  A chest x-ray takes a picture of the organs and structures inside the chest, including the heart, lungs, and blood vessels. This test can show several things, including, whether the heart is enlarges; whether fluid is building up in the lungs; and whether pacemaker / defibrillator leads are still in place. PRIOR TO VQ SCAN  Your physician has requested that you have an echocardiogram. Echocardiography is a painless test that uses sound waves to create images of your heart. It provides your doctor with information about the size and shape of your heart and how well your heart's chambers and valves are working. This procedure takes approximately one hour. There are no restrictions for this procedure.   Your physician has requested that you have a lexiscan myoview. For further information please visit [email or web address]. Please follow instruction sheet, as given.    Follow-Up:  Your physician recommends that you schedule a follow-up appointment in: AS NEEDED

## 2017-07-02 ENCOUNTER — Ambulatory Visit (HOSPITAL_COMMUNITY)
Admission: RE | Admit: 2017-07-02 | Discharge: 2017-07-02 | Disposition: A | Discharge status: Home / Self Care | Payer: BLUE CROSS/BLUE SHIELD | Source: Ambulatory Visit | Attending: Cardiology | Admitting: Cardiology

## 2017-07-02 DIAGNOSIS — R0602 Shortness of breath: Secondary | ICD-10-CM | POA: Diagnosis not present

## 2017-07-02 MED ORDER — TECHNETIUM TC 99M DIETHYLENETRIAME-PENTAACETIC ACID
29.0000 | Freq: Once | INTRAVENOUS | Status: AC | PRN
  Administered 2017-07-02: 29 via INTRAVENOUS

## 2017-07-02 MED ORDER — TECHNETIUM TO 99M ALBUMIN AGGREGATED
4.3000 | Freq: Once | INTRAVENOUS | Status: AC | PRN
  Administered 2017-07-02: 4.3 via INTRAVENOUS

## 2017-07-03 ENCOUNTER — Telehealth: Payer: Self-pay

## 2017-07-03 ENCOUNTER — Telehealth (HOSPITAL_COMMUNITY): Payer: Self-pay | Admitting: *Deleted

## 2017-07-03 NOTE — Telephone Encounter (Signed)
Patient given detailed instructions per Myocardial Perfusion Study Information Sheet for the test on 07/06/17 at 0930. Patient notified to arrive 15 minutes early and that it is imperative to arrive on time for appointment to keep from having the test rescheduled.  If you need to cancel or reschedule your appointment, please call the office within 24 hours of your appointment. . Patient verbalized understanding.Susana Gripp, Adelene Idler

## 2017-07-03 NOTE — Telephone Encounter (Signed)
Pharmacy wanted to verify the Euflexxa treatment.  They said that it is a 3 week treatment vs 5 injections.  Please clarify.

## 2017-07-03 NOTE — Telephone Encounter (Signed)
Spoke with Rayna Sexton, pharmacist, he stated that he can not break boxes, and they come 3 to a box, so he will send 2 boxes.

## 2017-07-03 NOTE — Telephone Encounter (Signed)
Typically it is 5 injections

## 2017-07-06 ENCOUNTER — Other Ambulatory Visit (HOSPITAL_COMMUNITY): Payer: BLUE CROSS/BLUE SHIELD

## 2017-07-06 ENCOUNTER — Encounter (HOSPITAL_COMMUNITY): Payer: BLUE CROSS/BLUE SHIELD

## 2017-08-04 ENCOUNTER — Other Ambulatory Visit: Payer: Self-pay | Admitting: Osteopathic Medicine

## 2017-08-04 DIAGNOSIS — I10 Essential (primary) hypertension: Secondary | ICD-10-CM

## 2017-08-08 ENCOUNTER — Telehealth (HOSPITAL_COMMUNITY): Payer: Self-pay | Admitting: *Deleted

## 2017-08-08 NOTE — Telephone Encounter (Signed)
Attempted to call patient regarding upcoming appointment- no answer.  Paul Fuentes  

## 2017-08-13 ENCOUNTER — Telehealth (HOSPITAL_COMMUNITY): Payer: Self-pay | Admitting: *Deleted

## 2017-08-13 NOTE — Telephone Encounter (Signed)
Left message on voicemail per DPR in reference to upcoming appointment scheduled on 08/15/17 at 0945 with detailed instructions given per Myocardial Perfusion Study Information Sheet for the test. LM to arrive 15 minutes early, and that it is imperative to arrive on time for appointment to keep from having the test rescheduled. If you need to cancel or reschedule your appointment, please call the office within 24 hours of your appointment. Failure to do so may result in a cancellation of your appointment, and a $50 no show fee. Phone number given for call back for any questions.

## 2017-08-14 ENCOUNTER — Encounter (HOSPITAL_COMMUNITY): Payer: BLUE CROSS/BLUE SHIELD

## 2017-08-14 ENCOUNTER — Other Ambulatory Visit (HOSPITAL_COMMUNITY): Payer: BLUE CROSS/BLUE SHIELD

## 2017-08-15 ENCOUNTER — Other Ambulatory Visit (HOSPITAL_COMMUNITY): Payer: BLUE CROSS/BLUE SHIELD

## 2017-08-15 ENCOUNTER — Encounter (HOSPITAL_COMMUNITY): Payer: BLUE CROSS/BLUE SHIELD

## 2017-08-22 ENCOUNTER — Telehealth (HOSPITAL_COMMUNITY): Payer: Self-pay | Admitting: *Deleted

## 2017-08-22 NOTE — Telephone Encounter (Signed)
Left message on voicemail per DPR in reference to upcoming appointment scheduled on 08/28/17 with detailed instructions given per Myocardial Perfusion Study Information Sheet for the test. LM to arrive 15 minutes early, and that it is imperative to arrive on time for appointment to keep from having the test rescheduled. If you need to cancel or reschedule your appointment, please call the office within 24 hours of your appointment. Failure to do so may result in a cancellation of your appointment, and a $50 no show fee. Phone number given for call back for any questions. Nyeemah Jennette Jacqueline    

## 2017-08-28 ENCOUNTER — Other Ambulatory Visit (HOSPITAL_COMMUNITY): Payer: BLUE CROSS/BLUE SHIELD

## 2017-08-28 ENCOUNTER — Ambulatory Visit (HOSPITAL_COMMUNITY): Payer: BLUE CROSS/BLUE SHIELD | Attending: Cardiology

## 2017-09-03 ENCOUNTER — Telehealth (HOSPITAL_COMMUNITY): Payer: Self-pay | Admitting: Cardiology

## 2017-09-03 NOTE — Telephone Encounter (Signed)
07-03-17- per donna -Patient (pt has to go out of town) Patient has cancelled appts on 9/21,10/30,10/31, and no-showed on 11/13.Marland Kitchen.RG  Both his echo and nuclear workqueue order will be removed.

## 2017-09-13 ENCOUNTER — Telehealth: Payer: Self-pay | Admitting: Osteopathic Medicine

## 2017-09-13 ENCOUNTER — Other Ambulatory Visit: Payer: Self-pay

## 2017-09-13 NOTE — Telephone Encounter (Signed)
Opened in error

## 2017-09-13 NOTE — Telephone Encounter (Signed)
Paul ChurchesKelsi pt called about getting an injection for knees and was told he could get one injec but not the other has been waiting for a call back about it being approved can you call him back today [phone]

## 2017-09-14 ENCOUNTER — Other Ambulatory Visit: Payer: Self-pay | Admitting: Osteopathic Medicine

## 2017-09-14 NOTE — Telephone Encounter (Signed)
Called speciality pharmacy ([phone] - Provider line)- the order was put on hold. They attempted to contact Pt three times and mailed a letter. They have not heard back from him yet. Once they hear from him, they can discuss shipment information.   Attempted to contact Pt, no answer. Left detailed VM that he needs to call the pharmacy ([phone] - Pt line). Phone number provided.  

## 2017-09-14 NOTE — Telephone Encounter (Signed)
Called speciality pharmacy [phone] - Provider line)- the order was put on hold. They attempted to contact Pt three times and mailed a letter. They have not heard back from him yet. Once they hear from him, they can discuss shipment information.   Attempted to contact Pt, no answer. Left detailed VM that he needs to call the pharmacy [phone] - Pt line). Phone number provided.

## 2017-09-15 MED ORDER — APIXABAN 5 MG PO TABS: 5.0000 mg | ORAL_TABLET | Freq: Two times a day (BID) | ORAL | 3 refills | Status: DC

## 2017-09-15 NOTE — Telephone Encounter (Signed)
Sent!

## 2017-09-17 ENCOUNTER — Other Ambulatory Visit: Payer: Self-pay | Admitting: Osteopathic Medicine

## 2017-09-17 NOTE — Telephone Encounter (Signed)
Called pt back and he states he spoke with his pharmacy on Friday and cleared up questions they had. Will await medication to arrive in office. KG LPN

## 2017-09-18 ENCOUNTER — Telehealth (HOSPITAL_COMMUNITY): Payer: Self-pay | Admitting: *Deleted

## 2017-09-18 NOTE — Telephone Encounter (Signed)
Patient given detailed instructions per Myocardial Perfusion Study Information Sheet for the test on 09/19/17 at 1230. Patient notified to arrive 15 minutes early and that it is imperative to arrive on time for appointment to keep from having the test rescheduled.  If you need to cancel or reschedule your appointment, please call the office within 24 hours of your appointment. . Patient verbalized understanding.Paul Fuentes W    

## 2017-09-19 ENCOUNTER — Encounter (HOSPITAL_COMMUNITY): Payer: BLUE CROSS/BLUE SHIELD

## 2017-09-19 ENCOUNTER — Ambulatory Visit (HOSPITAL_COMMUNITY): Payer: BLUE CROSS/BLUE SHIELD

## 2017-09-19 ENCOUNTER — Encounter (INDEPENDENT_AMBULATORY_CARE_PROVIDER_SITE_OTHER): Payer: Self-pay

## 2017-09-19 ENCOUNTER — Ambulatory Visit (HOSPITAL_COMMUNITY): Payer: BLUE CROSS/BLUE SHIELD | Attending: Cardiology

## 2017-09-19 DIAGNOSIS — R0609 Other forms of dyspnea: Secondary | ICD-10-CM | POA: Diagnosis not present

## 2017-09-19 DIAGNOSIS — Z86718 Personal history of other venous thrombosis and embolism: Secondary | ICD-10-CM | POA: Diagnosis not present

## 2017-09-19 DIAGNOSIS — I10 Essential (primary) hypertension: Secondary | ICD-10-CM | POA: Diagnosis not present

## 2017-09-19 DIAGNOSIS — R0602 Shortness of breath: Secondary | ICD-10-CM | POA: Diagnosis not present

## 2017-09-19 DIAGNOSIS — R0600 Dyspnea, unspecified: Secondary | ICD-10-CM | POA: Diagnosis present

## 2017-09-19 LAB — MYOCARDIAL PERFUSION IMAGING
CHL CUP NUCLEAR SRS: 7
CHL CUP NUCLEAR SSS: 8
LV sys vol: 31 mL
LVDIAVOL: 93 mL (ref 62–150)
Peak HR: 116 {beats}/min
RATE: 0.33
Rest HR: 74 {beats}/min
SDS: 1
TID: 1

## 2017-09-19 MED ORDER — TECHNETIUM TC 99M TETROFOSMIN IV KIT
32.7000 | PACK | Freq: Once | INTRAVENOUS | Status: AC | PRN
  Administered 2017-09-19: 32.7 via INTRAVENOUS
  Filled 2017-09-19: qty 33

## 2017-09-19 MED ORDER — REGADENOSON 0.4 MG/5ML IV SOLN
0.4000 mg | Freq: Once | INTRAVENOUS | Status: AC
  Administered 2017-09-19: 0.4 mg via INTRAVENOUS

## 2017-09-19 MED ORDER — TECHNETIUM TC 99M TETROFOSMIN IV KIT
9.6000 | PACK | Freq: Once | INTRAVENOUS | Status: AC | PRN
  Administered 2017-09-19: 9.6 via INTRAVENOUS
  Filled 2017-09-19: qty 10

## 2017-09-20 ENCOUNTER — Ambulatory Visit (HOSPITAL_COMMUNITY): Payer: BLUE CROSS/BLUE SHIELD

## 2017-10-02 ENCOUNTER — Telehealth: Payer: Self-pay | Admitting: Osteopathic Medicine

## 2017-10-02 NOTE — Telephone Encounter (Signed)
Noted  

## 2017-10-02 NOTE — Telephone Encounter (Signed)
Paul Fuentes, pt decided to put injections on hold because of series of injections and his deductible.Marland Kitchen..Marland Kitchen

## 2017-10-03 NOTE — Telephone Encounter (Signed)
Thank you :)

## 2018-02-02 ENCOUNTER — Other Ambulatory Visit: Payer: Self-pay | Admitting: Osteopathic Medicine

## 2018-02-02 DIAGNOSIS — I10 Essential (primary) hypertension: Secondary | ICD-10-CM

## 2018-02-11 ENCOUNTER — Other Ambulatory Visit: Payer: Self-pay

## 2018-02-11 DIAGNOSIS — I10 Essential (primary) hypertension: Secondary | ICD-10-CM

## 2018-02-11 MED ORDER — AMLODIPINE BESY-BENAZEPRIL HCL 5-20 MG PO CAPS: 1.0000 | ORAL_CAPSULE | Freq: Every day | ORAL | 0 refills | Status: DC

## 2018-02-18 ENCOUNTER — Other Ambulatory Visit: Payer: Self-pay

## 2018-02-18 DIAGNOSIS — I10 Essential (primary) hypertension: Secondary | ICD-10-CM

## 2018-02-18 MED ORDER — AMLODIPINE BESY-BENAZEPRIL HCL 5-20 MG PO CAPS: 1.0000 | ORAL_CAPSULE | Freq: Every day | ORAL | 0 refills | Status: DC

## 2018-09-18 ENCOUNTER — Ambulatory Visit: Payer: BLUE CROSS/BLUE SHIELD | Admitting: Family Medicine

## 2018-09-18 ENCOUNTER — Encounter: Payer: Self-pay | Admitting: Family Medicine

## 2018-09-18 VITALS — BP 160/103 | HR 83 | Ht 66.0 in | Wt 218.0 lb

## 2018-09-18 DIAGNOSIS — I82512 Chronic embolism and thrombosis of left femoral vein: Secondary | ICD-10-CM | POA: Diagnosis not present

## 2018-09-18 DIAGNOSIS — I10 Essential (primary) hypertension: Secondary | ICD-10-CM | POA: Diagnosis not present

## 2018-09-18 DIAGNOSIS — Z86718 Personal history of other venous thrombosis and embolism: Secondary | ICD-10-CM

## 2018-09-18 DIAGNOSIS — M1711 Unilateral primary osteoarthritis, right knee: Secondary | ICD-10-CM | POA: Diagnosis not present

## 2018-09-18 DIAGNOSIS — M25561 Pain in right knee: Secondary | ICD-10-CM

## 2018-09-18 DIAGNOSIS — G8929 Other chronic pain: Secondary | ICD-10-CM | POA: Diagnosis not present

## 2018-09-18 NOTE — Progress Notes (Signed)
Paul Fuentes is a 62 y.o. male who presents to [facility] today for right knee pain.  Paul Fuentes has a history of right knee pain due to DJD.  He has had multiple steroid injections in the remote past.  He was last seen about a year and a half ago.  We are in the process of getting approval for Visco supplementation but he was somewhat lost to follow-up.  He has had pain off and on for years but notes over the last few weeks is been worsening.  He notes pain and swelling in his right knee.  He denies any recent injury.  He notes activity worsens his pain.    He has a pertinent medical history for DVT and liver transplant.  He takes medications listed below.  He notes he is not tolerant of his blood pressure medications and does not want to try any right now.    ROS:  As above  Exam:  BP (!) 160/103   Pulse 83   Ht 5\' 6"  (1.676 m)   Wt 218 lb (98.9 kg)   BMI 35.19 kg/m  General: Well Developed, well nourished, and in no acute distress.  Neuro/Psych: Alert and oriented x3, extra-ocular muscles intact, able to move all 4 extremities, sensation grossly intact. Skin: Warm and dry, no rashes noted.  Respiratory: Not using accessory muscles, speaking in full sentences, trachea midline.  Cardiovascular: Pulses palpable, no extremity edema. Abdomen: Does not appear distended. MSK: Right knee moderate effusion otherwise well-appearing Range of motion 5-100 degrees with crepitations. Stable ligamentous exam. Not particularly tender to palpation.  Procedure: Real-time Ultrasound Guided Injection of right knee Device: GE Logiq E   Images permanently stored and available for review in the ultrasound unit. Verbal informed consent obtained.  Discussed risks and benefits of procedure. Warned about infection bleeding damage to structures skin hypopigmentation and fat atrophy among others. Patient expresses understanding and agreement Time-out conducted.    Noted no overlying erythema, induration, or other signs of local infection.   Skin prepped in a sterile fashion.   Local anesthesia: Topical Ethyl chloride.   With sterile technique and under real time ultrasound guidance:  40 mg of Kenalog and 4 mL of Marcaine injected easily.   Completed without difficulty   Pain immediately resolved suggesting accurate placement of the medication.   Advised to call if fevers/chills, erythema, induration, drainage, or persistent bleeding.   Images permanently stored and available for review in the ultrasound unit.  Impression: Technically successful ultrasound guided injection.   Assessment and Plan: 62 y.o. male with  Right knee pain due to DJD.  Steroid injection today.  Discussed ultimately that he will need a knee replacement.  He like to delay until next year if possible.  Discussed possibility of repeat steroid injection, Visco supplementation, PRP, and potentially geniculate nerve ablation if needed.  Additionally discussed his anticoagulation.  In a month or 2 he will have a gap where he will not have health insurance before he gets Medicare in March.  Discussed strategies to make sure he does not run out of Eliquis.  Additionally discussed strategies to make sure that he can afford to not run out of his antirejection medications for liver transplant.  Additionally his been sometime since he has been seen by his primary care provider for general health concern.  Recommend that he schedule a follow-up appointment in the near future with Dr. Lyn Hollingshead.   I spent 25 minutes with  this patient, greater than 50% was face-to-face time counseling regarding treatment plan diagnosis and as above.  Additionally discussed strategies for knee arthritis pain.   No orders of the defined types were placed in this encounter.  No orders of the defined types were placed in this encounter.   Historical information moved to improve visibility of documentation.   Past Medical History:  Diagnosis Date  . 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  History of unprovoked distal left lower extra may DVT in 2000, 6 weeks of enoxaparin.  History of autoimmune hepatitis and liver transplant in 2008, complicated by lower extremity DVT.  History of ulcerative colitis, status post colectomy 2001 Hematology consult 03/2016. Regarding recommendation for treatment of her current pains, patient with multiple DVT, vascular surgery requested second opinion. Hematology noted would be reasonable to recommend anticoagulation but patient was not interested in this. Was encouraged to consider prophylactic anticoagulation in increased risk areas such as surgery. Recommended rivaroxaban 10 mg daily or apixiban 2.5 mg twice a day 3-5 days before a procedure and up to one month after the procedure. Blood pressure 169/113 per records in June 2017  . Hypertension   . Liver transplant recipient [facility](HCC) 06/29/2016  . Ulcerative colitis [facility](HCC)    Past Surgical History:  Procedure Laterality Date  . APPENDECTOMY    . COLECTOMY  2001  . LIVER TRANSPLANT  05/2007  . REPLACEMENT TOTAL KNEE  09/2011   Social History   Tobacco Use  . Smoking status: Former Games developermoker  . Smokeless tobacco: Never Used  Substance Use Topics  . Alcohol use: No    Comment: Occasional   family history includes Colon cancer in his father; Ulcerative colitis in his mother.  Medications: Current Outpatient Medications  Medication Sig Dispense Refill  . ELIQUIS 5 MG TABS tablet TAKE 1 TABLET BY MOUTH TWICE A DAY 180 tablet 3  . mycophenolate (CELLCEPT) 500 MG tablet Take 500 mg by mouth daily.    Marland Kitchen. omeprazole (PRILOSEC) 20 MG capsule Take 20 mg by mouth 2 (two) times daily.  0  . predniSONE (DELTASONE) 5 MG tablet Take 5 mg by mouth daily with breakfast.    . tacrolimus (PROGRAF) 1 MG capsule Take 1 mg by mouth 2  (two) times daily.     No current facility-administered medications for this visit.    No Known Allergies    Discussed warning signs or symptoms. Please see discharge instructions. Patient expresses understanding.

## 2018-09-18 NOTE — Patient Instructions (Addendum)
Thank you for coming in today. Call or go to the ER if you develop a large red swollen joint with extreme pain or oozing puss.  Follow up with Dr Lyn HollingsheadAlexander in the near future.   Schedule a nurse visit for a shingles shot soon.

## 2018-10-02 ENCOUNTER — Encounter: Payer: Self-pay | Admitting: Osteopathic Medicine

## 2018-10-02 ENCOUNTER — Ambulatory Visit: Payer: BLUE CROSS/BLUE SHIELD | Admitting: Osteopathic Medicine

## 2018-10-02 VITALS — BP 166/108 | HR 76 | Temp 97.6°F | Wt 214.4 lb

## 2018-10-02 DIAGNOSIS — M1711 Unilateral primary osteoarthritis, right knee: Secondary | ICD-10-CM | POA: Diagnosis not present

## 2018-10-02 DIAGNOSIS — I10 Essential (primary) hypertension: Secondary | ICD-10-CM

## 2018-10-02 DIAGNOSIS — Z944 Liver transplant status: Secondary | ICD-10-CM

## 2018-10-02 DIAGNOSIS — Z86718 Personal history of other venous thrombosis and embolism: Secondary | ICD-10-CM

## 2018-10-02 NOTE — Progress Notes (Signed)
HPI: Paul Fuentes is a 62 y.o. male who  has a past medical history of History of DVT (deep vein thrombosis) (06/29/2016), Hypertension, Liver transplant recipient [facility](HCC) (06/29/2016), and Ulcerative colitis (HCC).  he [facility] today, 10/02/18,  for chief complaint of: Follow up blood pressure Medication maintenance  Recently saw Dr Denyse Amassorey 2 weeks ago for knee pain. Had been some time since he'd seen me (last visit 03/2017) for general health follow-up, Dr Denyse Amassorey recommended he make an appointment.    HTN: Reports intolerance to BP medications. BP 160/103 at visit w/ Dr Denyse Amassorey. Was on amlodipine-benazepril 5-20 mg daily in the past w/o reported problems, BP was 145/95. Meds D/C 02/2018 since he had not followed up in the office. He does not want to get back on Rx.   Hx multiple DVT: concerned about losing insurance and being off Eliquis.   Hx liver transplant: concerned about losing anti-rejection medications. Recent visit w/ transplant team, reports lab work done, we don't have records.   Last labs on file 03/2017.     Past medical, surgical, social and family history reviewed:  Patient Active Problem List   Diagnosis Date Noted  . Right knee pain 03/28/2017  . Dyspnea on exertion 03/27/2017  . 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 Left leg DVT (HCC) 08/30/2016  . Cardiac risk counseling 07/13/2016  . 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 Essential hypertension 06/29/2016  . History of DVT (deep vein thrombosis) 06/29/2016  . Liver transplant recipient [facility](HCC) 06/29/2016  . History of total colectomy 06/29/2016    Past Surgical History:  Procedure Laterality Date  . APPENDECTOMY    . COLECTOMY  2001  . LIVER TRANSPLANT  05/2007  . REPLACEMENT TOTAL KNEE  09/2011    Social History   Tobacco Use  . Smoking status: Former Games developermoker  . Smokeless tobacco: Never Used  Substance Use Topics  . Alcohol use: No   Comment: Occasional    Family History  Problem Relation Age of Onset  . Ulcerative colitis Mother   . Colon cancer Father      Current medication list and allergy/intolerance information reviewed:    Current Outpatient Medications  Medication Sig Dispense Refill  . ELIQUIS 5 MG TABS tablet TAKE 1 TABLET BY MOUTH TWICE A DAY 180 tablet 3  . mycophenolate (CELLCEPT) 500 MG tablet Take 500 mg by mouth daily.    Marland Kitchen. omeprazole (PRILOSEC) 20 MG capsule Take 20 mg by mouth 2 (two) times daily.  0  . predniSONE (DELTASONE) 5 MG tablet Take 5 mg by mouth daily with breakfast.    . tacrolimus (PROGRAF) 1 MG capsule Take 1 mg by mouth 2 (two) times daily.     No current facility-administered medications for this visit.     No Known Allergies    Review of Systems:  Constitutional:  No  fever, no chills, No recent illnes.   HEENT: No  headache, no vision change  Cardiac: No  chest pain, No  pressure, No palpitations, No  Orthopnea  Respiratory:  No  shortness of breath. No  Cough  Gastrointestinal: No  abdominal pain  Musculoskeletal: No new myalgia/arthralgia  Endocrine: No cold intolerance,  No heat intolerance.  Neurologic: No  weakness, No  dizziness,  Psychiatric: No  concerns with depression, No  concerns with anxiety  Exam:  BP (!) 166/108 (BP Location: Left Arm, Patient Position: Sitting, Cuff Size: Normal)  Pulse 76   Temp 97.6 F (36.4 C) (Oral)   Wt 214 lb 6.4 oz (97.3 kg)   BMI 34.61 kg/m   Constitutional: VS see above. General Appearance: alert, well-developed, well-nourished, NAD  Eyes: Normal lids and conjunctive, non-icteric sclera  Ears, Nose, Mouth, Throat: MMM, Normal external inspection ears/nares/mouth/lips/gums.   Neck: No masses, trachea midline.  Respiratory: Normal respiratory effort. no wheeze, no rhonchi, no rales  Cardiovascular: S1/S2 normal, no murmur, no rub/gallop auscultated. RRR. No lower extremity edema.  Musculoskeletal: Gait  normal.   Psychiatric: Normal judgment/insight. Normal mood and affect. Oriented x3.     ASSESSMENT/PLAN: The primary encounter diagnosis was Essential hypertension. Diagnoses of Primary osteoarthritis of right knee, History of recurrent deep vein thrombosis (DVT), and Liver transplant recipient [facility]) were also pertinent to this visit.   Discussed risk/benefit of Rx/no Rx for BP, pt aware of risks of death from complications of untreated BP  Requested records for most recent labs      Patient Instructions   For cash prices of prescriptions and savings card/coupons: see [email or web address]  I'll see if we can arrange some samples of Eliquis to hold you over          Visit summary with medication list and pertinent instructions was printed for patient to review. All questions at time of visit were answered - patient instructed to contact office with any additional concerns or updates. ER/RTC precautions were reviewed with the patient.   Note: Total time spent 15 minutes, greater than 50% of the visit was spent face-to-face counseling and coordinating care for the above diagnoses listed in assessment/plan.   Please note: voice recognition software was used to produce this document, and typos may escape review. Please contact Dr. Lyn Hollingshead for any needed clarifications.     Follow-up plan: Return in about 6 months (around 04/03/2019) for "Welcome to Medicare" visit when you have Medicare! Marland Kitchen

## 2018-10-02 NOTE — Patient Instructions (Signed)
   For cash prices of prescriptions and savings card/coupons: see [email or web address]  I'll see if we can arrange some samples of Eliquis to hold you over

## 2018-10-03 ENCOUNTER — Telehealth: Payer: Self-pay | Admitting: Osteopathic Medicine

## 2018-10-03 NOTE — Telephone Encounter (Signed)
-----   Message from Neldon LabellaHolden S Mabe, CMA sent at 10/02/2018  3:15 PM EST ----- Regarding: FW: ?eliquis samples? Left message for the drug representative to call back about samples.  ----- Message ----- From: Sunnie NielsenAlexander, Natalie, DO Sent: 10/02/2018   2:26 PM EST To: Neldon LabellaHolden S Mabe, CMA Subject: ?eliquis samples?                              Can we reach out to Eliquis rep and se if we can get this patient maybe 3-4 months of samples? He will be losing insurance in the new year until he's eligible for Medicare 12/2018

## 2018-10-03 NOTE — Telephone Encounter (Signed)
Received 6 boxes from Drug Rep on 10/03/2018. Dr. Lyn HollingsheadAlexander signed for them.   Lot: [id number]  EXP: 09/14/2020 Patient will pick up around 10/07/2018.

## 2018-10-07 ENCOUNTER — Other Ambulatory Visit: Payer: Self-pay | Admitting: Osteopathic Medicine

## 2018-10-29 ENCOUNTER — Other Ambulatory Visit: Payer: Self-pay

## 2018-10-29 ENCOUNTER — Telehealth: Payer: Self-pay

## 2018-10-29 MED ORDER — APIXABAN 5 MG PO TABS: 5.0000 mg | ORAL_TABLET | Freq: Two times a day (BID) | ORAL | 3 refills | Status: DC

## 2018-10-29 NOTE — Telephone Encounter (Signed)
[facility] sending fax request for eliquis med. Made several attempts to contact pt to verify current pharmacy. Rx sent 09/2018 to [facility] for one year. Direct call back info provided.

## 2019-01-13 ENCOUNTER — Telehealth: Payer: Self-pay | Admitting: Osteopathic Medicine

## 2019-01-13 DIAGNOSIS — M1711 Unilateral primary osteoarthritis, right knee: Secondary | ICD-10-CM

## 2019-01-13 NOTE — Telephone Encounter (Signed)
Pt called and would like to get Ortho shots Authorized as him and Dr.Corey discussed now that he has Medicare and would like to know when he should schedule this appt time with COVID going around right now? Please advise pt

## 2019-01-13 NOTE — Telephone Encounter (Signed)
Please prior auth Orthovisc and schedule patient.

## 2019-01-13 NOTE — Telephone Encounter (Signed)
Denyse Amass to review and see when he would like to see Pt, routing to [place] as well to see if PA is required.

## 2019-01-14 NOTE — Telephone Encounter (Signed)
Information has been sent to [facility] and waiting on a response.  

## 2019-01-16 NOTE — Telephone Encounter (Signed)
Received a fax from [facility] asking for clinical notes and they have been submitted. Waiting on a response.

## 2019-01-20 NOTE — Telephone Encounter (Signed)
I called [employer] to verify if Monovisc was preferred over Orthovisc and per Denean at [facility] the preferred product is Gelsyn 3. I asked Dr. Denyse Amass if he was ok with changing and he was ok and wanted me to follow up and see if the patient was ok with switching.    I called patient to leave him a message to call back to see if patient is ok with changing his injection from Orthovisc to [place] per insurance guidelines.

## 2019-01-20 NOTE — Telephone Encounter (Signed)
I called patient to let him know that we are waiting on a response from his insurance and I will let him know as soon as I have more information on the injections and he voices understanding.

## 2019-01-20 NOTE — Telephone Encounter (Signed)
Patient is agreeable to the Gelysn 3 injections. I have placed the correct pharmacy per [facility]. Please advise.

## 2019-01-21 MED ORDER — SODIUM HYALURONATE (VISCOSUP) 16.8 MG/2ML IX SOSY: 16.8000 mg | PREFILLED_SYRINGE | INTRA_ARTICULAR | 0 refills | Status: AC

## 2019-01-21 NOTE — Telephone Encounter (Signed)
Gelysn 3 is not listed as a medication in my E prescribing system.  Please call the pharmacy and call the medication and on my behalf.

## 2019-01-21 NOTE — Telephone Encounter (Signed)
This needs to be ordered from the pharmacy and they will call me to verify our address so it can be shipped. Please advise.

## 2019-01-21 NOTE — Telephone Encounter (Signed)
OK to switch. Do I need to prescribe it or do we order it?

## 2019-01-21 NOTE — Addendum Note (Signed)
Addended by: Arvilla Market on: 01/21/2019 01:36 PM   Modules accepted: Orders

## 2019-01-28 NOTE — Telephone Encounter (Signed)
Called [facility] to check on the status of the knee injections and per Paul Fuentes the pharmacy has to run the benefits again. I advised her that I had spoke with insurance on 01/20/2019 to verify that Gelysn 3 was the preferred product and this was per this patients plan. I then advised her that I would like this expedited and she would let the insurance team know it has been approved and she would have them call the patient's insurance to get an approval so we can verify everything with the patient. I called the patient to give him an update and he was very appreciative. I advised the patient that I would keep following up as I knew more information and he voices understanding.

## 2019-02-05 NOTE — Telephone Encounter (Signed)
Called to check the status of the injection and per Sam at [facility] the benefits are still being verified and it has been expedited. Her estimate is about 2 more days.

## 2019-02-06 ENCOUNTER — Telehealth: Payer: Self-pay | Admitting: Osteopathic Medicine

## 2019-02-06 NOTE — Telephone Encounter (Signed)
Pharmacy calling to check on Prior Auth. Message sent to [facility].

## 2019-02-07 NOTE — Telephone Encounter (Signed)
Called and spoke with Paul Fuentes at [facility] and she states that their insurance team is waiting on major medical to give an answer. I spent almost 25-30 minutes on hold waiting for a response.   Patient is aware we are still waiting on a response from insurance.

## 2019-02-18 NOTE — Telephone Encounter (Addendum)
Spoke with Paul Fuentes at [facility] and she states the prescription was supposed to be contracted in the pharmacy which was  [facility] and per the note on her end it the office should have received a message and I advised Paul Fuentes that I have not received a message from them.   She is going to send an e-mail the supervisor to let them know we are having an issue. The number is [phone].  I was on hold for 30-35 minutes and the call was disconnected. I tried back again and was disconnected by the representative.

## 2019-02-18 NOTE — Telephone Encounter (Signed)
See other note working on for this patient.

## 2019-02-18 NOTE — Telephone Encounter (Signed)
I then called back again and was placed on hold and then was disconnected. I am now waiting on hold for a representative. I then spoke with Judeth Cornfield and their office has been experiencing technical difficulties and they will do what they can to get this figured out for the patient.  I will check the status at a later date.  Pat ID: [id number] Fax: [phone]

## 2019-02-25 ENCOUNTER — Other Ambulatory Visit: Payer: Self-pay | Admitting: Osteopathic Medicine

## 2019-02-25 NOTE — Telephone Encounter (Signed)
Noted     Closing encounter

## 2019-02-25 NOTE — Telephone Encounter (Signed)
Medication: Gelsyn-3 16.8MG /2ML Syringes is not covered by Paul Fuentes  Key: [id number]  COVERMYMEDS

## 2019-02-25 NOTE — Telephone Encounter (Signed)
Hello Paul Fuentes,  After going back and forth his insurance company multiple times Paul Fuentes just told me that they have denied the gel injections.  At this point I recommend you contact your insurance provider pharmacy benefit and see which injection medication they would like me to use.  It seems as though you are having trouble getting through to them as are you.  I am happy to continue to pursue this with you but it seems like we are struggling to get through.

## 2019-02-25 NOTE — Telephone Encounter (Signed)
Called and spoke with Paul Fuentes at [employer]. She reports this transaction has been cancelled. Insurance is not communicating. This is not a covered drug under this plan. Please advise.

## 2019-02-27 DIAGNOSIS — L720 Epidermal cyst: Secondary | ICD-10-CM | POA: Diagnosis not present

## 2019-02-27 DIAGNOSIS — Z85820 Personal history of malignant melanoma of skin: Secondary | ICD-10-CM | POA: Diagnosis not present

## 2019-02-27 DIAGNOSIS — C4441 Basal cell carcinoma of skin of scalp and neck: Secondary | ICD-10-CM | POA: Diagnosis not present

## 2019-02-27 DIAGNOSIS — L820 Inflamed seborrheic keratosis: Secondary | ICD-10-CM | POA: Diagnosis not present

## 2019-02-27 DIAGNOSIS — D485 Neoplasm of uncertain behavior of skin: Secondary | ICD-10-CM | POA: Diagnosis not present

## 2019-02-27 NOTE — Telephone Encounter (Signed)
Patient is considering going to [facility] to see if they may have other alternatives for his knee pain.   I have asked about price for shots for Synvisc from our pharmacy and he wants to know what you would bill so he can get a ball park of how much it would be out of pocket. Please advise.

## 2019-02-27 NOTE — Telephone Encounter (Signed)
Not sure how to answer this one, will route to Dr Denyse Amass

## 2019-03-03 NOTE — Telephone Encounter (Signed)
Routing to [place]. She should have this quote.

## 2019-03-03 NOTE — Telephone Encounter (Signed)
Patient is aware we are waiting on a price for the injections.

## 2019-03-03 NOTE — Telephone Encounter (Signed)
We will inquire to see how much it costs. We will get back with patient.

## 2019-03-11 DIAGNOSIS — L720 Epidermal cyst: Secondary | ICD-10-CM | POA: Diagnosis not present

## 2019-03-11 NOTE — Telephone Encounter (Signed)
Do you want to possibly try a PRP injection as this is one time and might be cheaper. Its around $400. Please advise.

## 2019-03-11 NOTE — Telephone Encounter (Signed)
Patient wants to know if there would be a $300 charge for all 3 injections or if you would charge this for each injection. Please advise.

## 2019-03-12 NOTE — Telephone Encounter (Signed)
Left message with information below and for patient to call us back to schedule an appointment. °

## 2019-03-12 NOTE — Telephone Encounter (Signed)
Patient is agreeable to the estimate of the PRP and would like a call around 3 pm today to set this up. Please advise.Marland Kitchen

## 2019-03-12 NOTE — Telephone Encounter (Signed)
We can absolutely try a PRP injection in clinic.  Please advise patient and have him schedule.  I believe the total cost is around $470 but we can double check before contacting patient.

## 2019-03-24 ENCOUNTER — Encounter: Payer: Self-pay | Admitting: Family Medicine

## 2019-03-24 ENCOUNTER — Ambulatory Visit (INDEPENDENT_AMBULATORY_CARE_PROVIDER_SITE_OTHER): Payer: Medicare Other | Admitting: Family Medicine

## 2019-03-24 VITALS — BP 198/112 | HR 78 | Temp 97.7°F | Wt 227.0 lb

## 2019-03-24 DIAGNOSIS — M1711 Unilateral primary osteoarthritis, right knee: Secondary | ICD-10-CM

## 2019-03-24 NOTE — Patient Instructions (Signed)
Thank you for coming in today. .redflgai Consider revaluation of blood pressure with primary care doctor.  As for the knee take easy for a  Few days and we will hope to see some benefit.

## 2019-03-24 NOTE — Progress Notes (Signed)
Patient presents to clinic today for previously arranged PRP injection to right knee.  He had not been taking NSAIDs or aspirin prior to injection. He has had trials and failure of typical steroid injection.  PEAK prp system used to injection 88ml of PRP to right knee.   Procedure: Real-time Ultrasound Guided Injection of right knee Device: GE Logiq E   Images permanently stored and available for review in the ultrasound unit. Verbal informed consent obtained.  Discussed risks and benefits of procedure. Warned about infection bleeding damage to structures skin hypopigmentation and fat atrophy among others. Patient expresses understanding and agreement Time-out conducted.   Noted no overlying erythema, induration, or other signs of local infection.   Skin prepped in a sterile fashion.   Local anesthesia: Topical Ethyl chloride.   With sterile technique and under real time ultrasound guidance:  PRP injected easily.   Completed without difficulty      Advised to call if fevers/chills, erythema, induration, drainage, or persistent bleeding.   Images permanently stored and available for review in the ultrasound unit.  Impression: Technically successful ultrasound guided injection.  Additionally blood pressure elevated recommend follow-up with PCP.

## 2019-04-02 ENCOUNTER — Ambulatory Visit: Payer: BLUE CROSS/BLUE SHIELD | Admitting: Osteopathic Medicine

## 2019-04-09 DIAGNOSIS — C4441 Basal cell carcinoma of skin of scalp and neck: Secondary | ICD-10-CM | POA: Diagnosis not present

## 2019-04-10 ENCOUNTER — Encounter: Payer: Self-pay | Admitting: Osteopathic Medicine

## 2019-04-10 ENCOUNTER — Ambulatory Visit (INDEPENDENT_AMBULATORY_CARE_PROVIDER_SITE_OTHER): Payer: Medicare Other | Admitting: Osteopathic Medicine

## 2019-04-10 VITALS — BP 173/110 | HR 76 | Temp 98.1°F | Wt 229.3 lb

## 2019-04-10 DIAGNOSIS — I10 Essential (primary) hypertension: Secondary | ICD-10-CM | POA: Diagnosis not present

## 2019-04-10 DIAGNOSIS — Z0000 Encounter for general adult medical examination without abnormal findings: Secondary | ICD-10-CM | POA: Diagnosis not present

## 2019-04-10 DIAGNOSIS — Z13220 Encounter for screening for lipoid disorders: Secondary | ICD-10-CM

## 2019-04-10 DIAGNOSIS — Z131 Encounter for screening for diabetes mellitus: Secondary | ICD-10-CM

## 2019-04-10 DIAGNOSIS — Z23 Encounter for immunization: Secondary | ICD-10-CM

## 2019-04-10 DIAGNOSIS — D649 Anemia, unspecified: Secondary | ICD-10-CM

## 2019-04-10 DIAGNOSIS — Z125 Encounter for screening for malignant neoplasm of prostate: Secondary | ICD-10-CM

## 2019-04-10 MED ORDER — TRAZODONE HCL 50 MG PO TABS: 25.0000 mg | ORAL_TABLET | Freq: Every evening | ORAL | 1 refills | Status: DC | PRN

## 2019-04-10 MED ORDER — SHINGRIX 50 MCG/0.5ML IM SUSR: 0.5000 mL | Freq: Once | INTRAMUSCULAR | 1 refills | Status: AC

## 2019-04-10 MED ORDER — AMLODIPINE BESY-BENAZEPRIL HCL 5-20 MG PO CAPS: 1.0000 | ORAL_CAPSULE | Freq: Every day | ORAL | 1 refills | Status: DC

## 2019-04-10 NOTE — Patient Instructions (Addendum)
Plan:   Will restart blood pressure medications.   I sent medications for sleep, too.   Will get labs done.   Rx for Shingles shot printed - please take to pharmacy.

## 2019-04-10 NOTE — Progress Notes (Signed)
Subjective:   Paul Fuentes is a 63 y.o. male who presents for an Initial Medicare Annual Wellness Visit.  Review of Systems   Overall feels ok.   Concerned about high blood pressure. Has declined BP meds in the past. No CP/SOB BP Readings from Last 3 Encounters:  04/10/19 (!) 173/110  03/24/19 (!) 198/112  10/02/18 (!) 166/108         Objective:    Today's Vitals   04/10/19 1525  BP: (!) 173/110  Pulse: 76  Temp: 98.1 F (36.7 C)  TempSrc: Oral  Weight: 229 lb 4.8 oz (104 kg)   Body mass index is 37.01 kg/m.  Advanced Directives 06/29/2016  Does Patient Have a Medical Advance Directive? Yes  Type of Advance Directive Living will;Healthcare Power of Attorney  Does patient want to make changes to medical advance directive? No - Patient declined  Copy of [place] in Chart? No - copy requested    Current Medications (verified) Outpatient Encounter Medications as of 04/10/2019  Medication Sig  . apixaban (ELIQUIS) 5 MG TABS tablet Take 1 tablet (5 mg total) by mouth 2 (two) times daily.  . mycophenolate (CELLCEPT) 500 MG tablet Take 500 mg by mouth daily.  Marland Kitchen omeprazole (PRILOSEC) 20 MG capsule Take 20 mg by mouth 2 (two) times daily.  . predniSONE (DELTASONE) 5 MG tablet Take 5 mg by mouth daily with breakfast.  . tacrolimus (PROGRAF) 1 MG capsule Take 1 mg by mouth 2 (two) times daily.   No facility-administered encounter medications on file as of 04/10/2019.     Allergies (verified) Patient has no known allergies.   History: Past Medical History:  Diagnosis Date  . 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  History of unprovoked distal left lower extra may DVT in 2000, 6 weeks of enoxaparin.  History of autoimmune hepatitis and liver transplant in 8338, complicated by lower extremity DVT.  History of ulcerative colitis, status post colectomy 2001  Hematology consult 03/2016. Regarding recommendation for treatment of her current pains, patient with multiple DVT, vascular surgery requested second opinion. Hematology noted would be reasonable to recommend anticoagulation but patient was not interested in this. Was encouraged to consider prophylactic anticoagulation in increased risk areas such as surgery. Recommended rivaroxaban 10 mg daily or apixiban 2.5 mg twice a day 3-5 days before a procedure and up to one month after the procedure. Blood pressure 169/113 per records in June 2017  . Hypertension   . 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 Ulcerative colitis [facility])    Past Surgical History:  Procedure Laterality Date  . APPENDECTOMY    . COLECTOMY  2001  . LIVER TRANSPLANT  05/2007  . REPLACEMENT TOTAL KNEE  09/2011   Family History  Problem Relation Age of Onset  . Ulcerative colitis Mother   . Colon cancer Father    Social History   Socioeconomic History  . Marital status: Divorced    Spouse name: Not on file  . Number of children: 2  . Years of education: Not on file  . Highest education level: Not on file  Occupational History  . Not on file  Social Needs  . Financial resource strain: No  . Food insecurity    Worry: No    Inability: No  . Transportation needs    Medical: No need    Non-medical: No need  Tobacco Use  . Smoking status: Former Games developermoker  . Smokeless tobacco: Never Used  Substance and Sexual Activity  . Alcohol use: No    Comment: Occasional  . Drug use: No  . Sexual activity: None recently  Lifestyle  . Physical activity    Days per week: 5 when working     Minutes per session: Not on file  . Stress: None significant   Relationships  . Social connections - some family locally     Talks on phone: Some    Gets together: Yes    Attends religious service: No    Active member of club or organization: No    Attends meetings of clubs or organizations: No    Relationship status: Single   Other Topics Concern  . Not on file  Social History Narrative  . Not on file   Tobacco Counseling Counseling given: Not Answered   Clinical Intake:                       Activities of Daily Living  No concerns, see scanned questionnaire.   Immunizations and Health Maintenance Immunization History  Administered Date(s) Administered  . Influenza,inj,Quad PF,6+ Mos 06/29/2016, 08/31/2018  . Influenza-Unspecified 09/21/2017  . Pneumococcal Polysaccharide-23 04/10/2019  . Tdap 07/13/2016   There are no preventive care reminders to display for this patient.  Patient Care Team: Paul Fuentes, Paul Shankman, DO as PCP - General (Osteopathic Medicine)  Indicate any recent Medical Services you may have received from other than [facility] providers in the past year (date may be approximate).    Assessment:   This is a routine wellness examination for Paul Fuentes.  Hearing/Vision screen No exam data present  Dietary issues and exercise activities discussed: No concerns      Goals   None    Depression Screen PHQ 2/9 Scores 04/11/2019 10/02/2018 03/27/2017  PHQ - 2 Score 0 0 0  PHQ- 9 Score - 3 -    Fall Risk Fall Risk  03/24/2019  Risk for fall due to : Impaired balance/gait  Follow up Education provided    Is the patient's home free of loose throw rugs in walkways, pet beds, electrical cords, etc?   yes      Grab bars in the bathroom? yes      Handrails on the stairs?   yes      Adequate lighting?   yes  Timed Get Up and Go performed: no concerns   Cognitive Function: no concerns         Screening Tests Health Maintenance  Topic Date Due  . PNA vac Low Risk Adult (1 of 2 - PCV13) 12/16/2018  . INFLUENZA VACCINE  05/17/2019  . COLONOSCOPY  07/13/2026  . TETANUS/TDAP  07/13/2026  . Hepatitis C Screening  Completed  . HIV Screening  Completed    Qualifies for Shingles Vaccine? yes  Cancer Screenings: Lung: Low Dose CT Chest recommended if Age 38-80 years, 30  pack-year currently smoking OR have quit w/in 15years. Patient does not qualify. Colorectal: not needed  Additional Screenings: Hepatitis C Screening: not needed      Plan:    The primary encounter diagnosis was Medicare annual wellness visit, initial. Diagnoses of Essential hypertension, Need for 23-polyvalent pneumococcal polysaccharide vaccine, Prostate cancer screening, Diabetes mellitus screening, Lipid screening, and Encounter for Medicare annual wellness exam were also pertinent to this visit.   I have personally reviewed and noted the following in the patient's chart:   .  Medical and social history . Use of alcohol, tobacco or illicit drugs  . Current medications and supplements . Functional ability and status . Nutritional status . Physical activity . Advanced directives . List of other physicians . Hospitalizations, surgeries, and ER visits in previous 12 months . Vitals . Screenings to include cognitive, depression, and falls . Referrals and appointments  In addition, I have reviewed and discussed with patient certain preventive protocols, quality metrics, and best practice recommendations. A written personalized care plan for preventive services as well as general preventive health recommendations were provided to patient.     Paul NielsenNatalie Jessy Calixte, DO   04/10/2019    Patient Instructions  Plan:   Will restart blood pressure medications.   I sent medications for sleep, too.   Will get labs done.   Rx for Shingles shot printed - please take to pharmacy.

## 2019-04-11 LAB — CBC
HCT: 36.7 % — ABNORMAL LOW (ref 38.5–50.0)
Hemoglobin: 11.8 g/dL — ABNORMAL LOW (ref 13.2–17.1)
MCH: 25.5 pg — ABNORMAL LOW (ref 27.0–33.0)
MCHC: 32.2 g/dL (ref 32.0–36.0)
MCV: 79.3 fL — ABNORMAL LOW (ref 80.0–100.0)
MPV: 12.4 fL (ref 7.5–12.5)
Platelets: 256 10*3/uL (ref 140–400)
RBC: 4.63 10*6/uL (ref 4.20–5.80)
RDW: 17.8 % — ABNORMAL HIGH (ref 11.0–15.0)
WBC: 11.9 10*3/uL — ABNORMAL HIGH (ref 3.8–10.8)

## 2019-04-11 MED ORDER — AMLODIPINE BESY-BENAZEPRIL HCL 5-20 MG PO CAPS: 1.0000 | ORAL_CAPSULE | Freq: Every day | ORAL | 1 refills | Status: DC

## 2019-04-11 MED ORDER — TRAZODONE HCL 50 MG PO TABS: 25.0000 mg | ORAL_TABLET | Freq: Every evening | ORAL | 1 refills | Status: DC | PRN

## 2019-04-11 NOTE — Addendum Note (Signed)
Addended by: Maryla Morrow on: 04/11/2019 01:13 PM   Modules accepted: Orders

## 2019-04-11 NOTE — Addendum Note (Signed)
Addended by: Towana Badger on: 04/11/2019 03:08 PM   Modules accepted: Orders

## 2019-04-15 LAB — COMPLETE METABOLIC PANEL WITH GFR
AG Ratio: 1.3 (calc) (ref 1.0–2.5)
ALT: 15 U/L (ref 9–46)
AST: 19 U/L (ref 10–35)
Albumin: 4.2 g/dL (ref 3.6–5.1)
Alkaline phosphatase (APISO): 62 U/L (ref 35–144)
BUN/Creatinine Ratio: 24 (calc) — ABNORMAL HIGH (ref 6–22)
BUN: 28 mg/dL — ABNORMAL HIGH (ref 7–25)
CO2: 21 mmol/L (ref 20–32)
Calcium: 8.8 mg/dL (ref 8.6–10.3)
Chloride: 108 mmol/L (ref 98–110)
Creat: 1.18 mg/dL (ref 0.70–1.25)
GFR, Est African American: 75 mL/min/{1.73_m2} (ref >=60)
GFR, Est Non African American: 64 mL/min/{1.73_m2} (ref >=60)
Globulin: 3.2 g/dL (calc) (ref 1.9–3.7)
Glucose, Bld: 98 mg/dL (ref 65–99)
Potassium: 4.4 mmol/L (ref 3.5–5.3)
Sodium: 137 mmol/L (ref 135–146)
Total Bilirubin: 0.4 mg/dL (ref 0.2–1.2)
Total Protein: 7.4 g/dL (ref 6.1–8.1)

## 2019-04-15 LAB — MANUAL DIFFERENTIAL
HCT: 36.7 % — ABNORMAL LOW (ref 38.5–50.0)
Hemoglobin: 11.8 g/dL — ABNORMAL LOW (ref 13.2–17.1)
MCH: 25.5 pg — ABNORMAL LOW (ref 27.0–33.0)
MCHC: 32.2 g/dL (ref 32.0–36.0)
MCV: 79.3 fL — ABNORMAL LOW (ref 80.0–100.0)
MPV: 12.4 fL (ref 7.5–12.5)
Platelets: 256 10*3/uL (ref 140–400)
RBC: 4.63 10*6/uL (ref 4.20–5.80)
RDW: 17.8 % — ABNORMAL HIGH (ref 11.0–15.0)
WBC: 11.9 10*3/uL — ABNORMAL HIGH (ref 3.8–10.8)

## 2019-04-15 LAB — LIPID PANEL
Cholesterol: 195 mg/dL (ref <200)
HDL: 45 mg/dL (ref >=40)
LDL Cholesterol (Calc): 125 mg/dL (calc) — ABNORMAL HIGH
Non-HDL Cholesterol (Calc): 150 mg/dL (calc) — ABNORMAL HIGH (ref <130)
Total CHOL/HDL Ratio: 4.3 (calc) (ref <5.0)
Triglycerides: 133 mg/dL (ref <150)

## 2019-04-15 LAB — TEST AUTHORIZATION 2

## 2019-04-15 LAB — PSA, TOTAL WITH REFLEX TO PSA, FREE: PSA, Total: 1.1 ng/mL (ref <=4.0)

## 2019-04-15 LAB — IRON, TOTAL/TOTAL IRON BINDING CAP: Iron: 211 ug/dL — ABNORMAL HIGH (ref 50–180)

## 2019-04-15 LAB — IRON,?TOTAL/TOTAL IRON BINDING CAP
%SAT: 52 % (calc) — ABNORMAL HIGH (ref 20–48)
TIBC: 407 mcg/dL (calc) (ref 250–425)

## 2019-04-15 LAB — TSH: TSH: 2.71 mIU/L (ref 0.40–4.50)

## 2019-04-21 DIAGNOSIS — Z4802 Encounter for removal of sutures: Secondary | ICD-10-CM | POA: Diagnosis not present

## 2019-04-24 ENCOUNTER — Encounter: Payer: Self-pay | Admitting: Osteopathic Medicine

## 2019-04-24 ENCOUNTER — Ambulatory Visit (INDEPENDENT_AMBULATORY_CARE_PROVIDER_SITE_OTHER): Payer: Medicare Other | Admitting: Osteopathic Medicine

## 2019-04-24 ENCOUNTER — Other Ambulatory Visit: Payer: Self-pay

## 2019-04-24 VITALS — BP 135/94 | HR 87 | Temp 97.8°F | Wt 225.8 lb

## 2019-04-24 DIAGNOSIS — Z944 Liver transplant status: Secondary | ICD-10-CM | POA: Diagnosis not present

## 2019-04-24 DIAGNOSIS — D509 Iron deficiency anemia, unspecified: Secondary | ICD-10-CM

## 2019-04-24 DIAGNOSIS — G4700 Insomnia, unspecified: Secondary | ICD-10-CM

## 2019-04-24 DIAGNOSIS — I10 Essential (primary) hypertension: Secondary | ICD-10-CM

## 2019-04-24 MED ORDER — CLONAZEPAM 0.5 MG PO TABS: 0.2500 mg | ORAL_TABLET | Freq: Every day | ORAL | 0 refills | Status: AC

## 2019-04-24 NOTE — Progress Notes (Signed)
HPI: Paul Fuentes is a 63 y.o. male who  has a past medical history of History of DVT (deep vein thrombosis) (06/29/2016), Hypertension, Liver transplant recipient [facility](HCC) (06/29/2016), and Ulcerative colitis (HCC).  he presents to [facility] today, 04/24/19,  for chief complaint of:  BP check  Labs review - anemia SOB Insomnia  Patient is tolerating blood pressure medications okay, blood pressure is a lot closer to goal, he is reluctant to adjust medications at this time but honestly given the way the blood pressures were looking before, I am pleased with progress.   BP Readings from Last 3 Encounters:  04/24/19 (!) 135/94  04/10/19 (!) 173/110  03/24/19 (!) 198/112   New concern today: Patient reports increasing shortness of breath on exertion over the past couple of months.  He had a similar issue a few years ago, cardiac work-up with nuclear stress test was negative.  He feels that he is having similar symptoms now.  No occupational respiratory exposures to dust or chemicals, smoked maybe a pack per day for 10 years but quit 30 years ago, no history of COPD or asthma.  No orthopnea or lower extremity swelling.  Anemia, present on recent lab work.  Not severe, I ordered some additional testing with pathologist Merrithew and iron studies.  The smear review was never done, the lab just got another CBC, did not even get a differential.  Iron was done but not ferritin.  Iron levels are actually on the high side, patient reports that he has been taking over-the-counter iron supplementation.   Patient reports the trazodone really did not do anything to help him sleep.  He tried the 50 mg dose which was ineffective and 100 mg dose which caused daytime grogginess the next morning.  He was previously on Klonopin for this issue, he reports he would take it maybe once a week or maybe twice a week.   At today's visit 04/24/19 ... PMH, PSH, FH reviewed and updated as  needed.  Current medication list and allergy/intolerance hx reviewed and updated as needed. (See remainder of HPI, ROS, Phys Exam below)   Recent Results (from the past 2160 hour(s))  CBC     Status: Abnormal   Collection Time: 04/10/19  4:26 PM  Result Value Ref Range   WBC 11.9 (H) 3.8 - 10.8 Thousand/uL   RBC 4.63 4.20 - 5.80 Million/uL   Hemoglobin 11.8 (L) 13.2 - 17.1 g/dL   HCT 95.636.7 (L) 21.338.5 - 08.650.0 %   MCV 79.3 (L) 80.0 - 100.0 fL   MCH 25.5 (L) 27.0 - 33.0 pg   MCHC 32.2 32.0 - 36.0 g/dL   RDW 57.817.8 (H) 46.911.0 - 62.915.0 %   Platelets 256 140 - 400 Thousand/uL   MPV 12.4 7.5 - 12.5 fL  COMPLETE METABOLIC PANEL WITH GFR     Status: Abnormal   Collection Time: 04/10/19  4:26 PM  Result Value Ref Range   Glucose, Bld 98 65 - 99 mg/dL    Comment: .            Fasting reference interval .    BUN 28 (H) 7 - 25 mg/dL   Creat 5.281.18 4.130.70 - 2.441.25 mg/dL    Comment: For patients >65 years of age, the reference limit for Creatinine is approximately 13% higher for people identified as African-American. .    GFR, Est Non African American 64 > OR = 60 mL/min/1.5473m2   GFR, Est African American  75 > OR = 60 mL/min/1.6973m2   BUN/Creatinine Ratio 24 (H) 6 - 22 (calc)   Sodium 137 135 - 146 mmol/L   Potassium 4.4 3.5 - 5.3 mmol/L   Chloride 108 98 - 110 mmol/L   CO2 21 20 - 32 mmol/L   Calcium 8.8 8.6 - 10.3 mg/dL   Total Protein 7.4 6.1 - 8.1 g/dL   Albumin 4.2 3.6 - 5.1 g/dL   Globulin 3.2 1.9 - 3.7 g/dL (calc)   AG Ratio 1.3 1.0 - 2.5 (calc)   Total Bilirubin 0.4 0.2 - 1.2 mg/dL   Alkaline phosphatase (APISO) 62 35 - 144 U/L   AST 19 10 - 35 U/L   ALT 15 9 - 46 U/L  Lipid panel     Status: Abnormal   Collection Time: 04/10/19  4:26 PM  Result Value Ref Range   Cholesterol 195 <200 mg/dL   HDL 45 > OR = 40 mg/dL   Triglycerides 696133 <295<150 mg/dL   LDL Cholesterol (Calc) 125 (H) mg/dL (calc)    Comment: Reference range: <100 . Desirable range <100 mg/dL for primary prevention;   <70  mg/dL for patients with CHD or diabetic patients  with > or = 2 CHD risk factors. Marland Kitchen. LDL-C is now calculated using the Martin-Hopkins  calculation, which is a validated novel method providing  better accuracy than the Friedewald equation in the  estimation of LDL-C.  Horald PollenMartin SS et al. Lenox AhrJAMA. 2841;324(402013;310(19): 2061-2068  (http://education.[facility].com/faq/FAQ164)    Total CHOL/HDL Ratio 4.3 <5.0 (calc)   Non-HDL Cholesterol (Calc) 150 (H) <130 mg/dL (calc)    Comment: For patients with diabetes plus 1 major ASCVD risk  factor, treating to a non-HDL-C goal of <100 mg/dL  (LDL-C of <10<70 mg/dL) is considered a therapeutic  option.   TSH     Status: None   Collection Time: 04/10/19  4:26 PM  Result Value Ref Range   TSH 2.71 0.40 - 4.50 mIU/L  PSA, Total with Reflex to PSA, Free     Status: None   Collection Time: 04/10/19  4:26 PM  Result Value Ref Range   PSA, Total 1.1 < OR = 4.0 ng/mL    Comment: The Total PSA value from this assay system is  standardized against the equimolar PSA standard.  The test result will be approximately 20% higher  when compared to the [facility]-standardized Total PSA  (Siemens assay). Comparison of serial PSA results  should be interpreted with this fact in mind. Marland Kitchen. PSA was performed using the Beckman Coulter  Immunoassay method. Values obtained from different  assay methods cannot be used interchangeably. PSA  levels, regardless of value, should not be  interpreted as absolute evidence of the presence or  absence of disease.   Manual Differential [facility]     Status: Abnormal   Collection Time: 04/10/19  4:26 PM  Result Value Ref Range   WBC 11.9 (H) 3.8 - 10.8 Thousand/uL   RBC 4.63 4.20 - 5.80 Million/uL   Hemoglobin 11.8 (L) 13.2 - 17.1 g/dL   HCT 27.236.7 (L) 53.638.5 - 64.450.0 %   MCV 79.3 (L) 80.0 - 100.0 fL   MCH 25.5 (L) 27.0 - 33.0 pg   MCHC 32.2 32.0 - 36.0 g/dL   RDW 03.417.8 (H) 74.211.0 - 59.515.0 %   Platelets 256 140 - 400 Thousand/uL   MPV 12.4 7.5 - 12.5  fL   Neutro Abs CANCELED     Comment: TEST NOT PERFORMED . The additional test  requested cannot be performed due to either the age or quantity of specimen.  Result canceled by the ancillary.   TEST AUTHORIZATION 2     Status: None   Collection Time: 04/10/19  4:26 PM  Result Value Ref Range   TEST NAME: CBC (INCLUDES DIFF/PLT) IRON     TEST CODE: [id number]    CLIENT CONTACT: RACHEL H    REPORT ALWAYS MESSAGE SIGNATURE      Comment: . The laboratory testing on this patient was verbally requested or confirmed by the ordering physician or his or her authorized representative after contact with an employee of [facility]. Federal regulations require that we maintain on file written authorization for all laboratory testing.  Accordingly we are asking that the ordering physician or his or her authorized representative sign a copy of this report and promptly return it to the client service representative. . . Signature:____________________________________________________ . Please fax this signed page to [phone] or return it via your [facility] courier.   Iron,Total/Total Iron Binding Cap     Status: Abnormal   Collection Time: 04/10/19  4:26 PM  Result Value Ref Range   Iron 211 (H) 50 - 180 mcg/dL   TIBC 956407 387250 - 564425 mcg/dL (calc)   %SAT 52 (H) 20 - 48 % (calc)          ASSESSMENT/PLAN: The primary encounter diagnosis was Essential hypertension. Diagnoses of Microcytic anemia, Liver transplant recipient [facility](HCC), and Insomnia, unspecified type were also pertinent to this visit.   I feel like anemia may be due to patient's medications with CellCept and Prograf.  Okay to monitor for now, will reach out to patient's gastroenterology specialist and see if they have anything to add, or if he may benefit from work-up for GI bleed, patient declines home stool cards for now.  Will need to recheck labs at any rate to ensure medication safety on blood  pressure medicines, will go ahead and recheck CBC with path review and iron levels at that time.  I am not sure that the anemia is sufficient enough for it to explain patient's shortness of breath, we may consider pulmonology referral or trial of inhalers, pending GI recommendations.  Issue has been stable over the past several months and no alarm symptoms.  Typically we would perform PFTs/spirometry in the office but these tests have been suspended due to concern for aerosolizing COVID in asymptomatic patients.   Orders Placed This Encounter  Procedures  . COMPLETE METABOLIC PANEL WITH GFR  . CBC (INCLUDES DIFF/PLT) WITH PATHOLOGIST REVIEW  . Fe+TIBC+Fer     Meds ordered this encounter  Medications  . clonazePAM (KLONOPIN) 0.5 MG tablet    Sig: Take 0.5-1 tablets (0.25-0.5 mg total) by mouth at bedtime.    Dispense:  15 tablet    Refill:  0    There are no Patient Instructions on file for this visit.    Follow-up plan: Return in about 4 weeks (around 05/22/2019) for LAB VISIT ONLY ./Pending recommendations from GI.                                                 ################################################# ################################################# ################################################# #################################################    Current Meds  Medication Sig  . amLODipine-benazepril (LOTREL) 5-20 MG capsule Take 1 capsule by mouth daily.  Marland Kitchen. apixaban (ELIQUIS) 5 MG TABS tablet  Take 1 tablet (5 mg total) by mouth 2 (two) times daily.  . mycophenolate (CELLCEPT) 500 MG tablet Take 500 mg by mouth daily.  Marland Kitchen omeprazole (PRILOSEC) 20 MG capsule Take 20 mg by mouth 2 (two) times daily.  . predniSONE (DELTASONE) 5 MG tablet Take 5 mg by mouth daily with breakfast.  . tacrolimus (PROGRAF) 1 MG capsule Take 1 mg by mouth 2 (two) times daily.    No Known Allergies     Review of  Systems:  Constitutional: No recent illness, +fatigue  HEENT: No  headache, no vision change  Cardiac: No  chest pain, No  pressure, No palpitations  Respiratory:  +shortness of breath. No  Cough  Gastrointestinal: No  abdominal pain, no change on bowel habits  Musculoskeletal: No new myalgia/arthralgia  Skin: No  Rash  Hem/Onc: No  easy bruising/bleeding, No  abnormal lumps/bumps  Neurologic: No  weakness, No  Dizziness  Psychiatric: No  concerns with depression, No  concerns with anxiety  Exam:  BP (!) 135/94 (BP Location: Left Arm, Patient Position: Sitting, Cuff Size: Normal)   Pulse 87   Temp 97.8 F (36.6 C) (Oral)   Wt 225 lb 12.8 oz (102.4 kg)   BMI 36.45 kg/m   Constitutional: VS see above. General Appearance: alert, well-developed, well-nourished, NAD  Eyes: Normal lids and conjunctive, non-icteric sclera  Neck: No masses, trachea midline.   Respiratory: Normal respiratory effort. no wheeze, no rhonchi, no rales  Cardiovascular: S1/S2 normal, no murmur, no rub/gallop auscultated. RRR.   Musculoskeletal: Gait normal. Symmetric and independent movement of all extremities  Neurological: Normal balance/coordination. No tremor.  Skin: warm, dry, intact.   Psychiatric: Normal judgment/insight. Normal mood and affect. Oriented x3.       Visit summary with medication list and pertinent instructions was printed for patient to review, patient was advised to alert [place] if any updates are needed. All questions at time of visit were answered - patient instructed to contact office with any additional concerns. ER/RTC precautions were reviewed with the patient and understanding verbalized.    Please note: voice recognition software was used to produce this document, and typos may escape review. Please contact Dr. Sheppard Coil for any needed clarifications.    Follow up plan: Return in about 4 weeks (around 05/22/2019) for LAB VISIT ONLY .

## 2019-04-25 DIAGNOSIS — Z944 Liver transplant status: Secondary | ICD-10-CM | POA: Diagnosis not present

## 2019-04-25 DIAGNOSIS — Z4823 Encounter for aftercare following liver transplant: Secondary | ICD-10-CM | POA: Diagnosis not present

## 2019-05-05 DIAGNOSIS — C441221 Squamous cell carcinoma of skin of right upper eyelid, including canthus: Secondary | ICD-10-CM | POA: Diagnosis not present

## 2019-05-21 DIAGNOSIS — C4441 Basal cell carcinoma of skin of scalp and neck: Secondary | ICD-10-CM | POA: Diagnosis not present

## 2019-05-21 DIAGNOSIS — Z483 Aftercare following surgery for neoplasm: Secondary | ICD-10-CM | POA: Diagnosis not present

## 2019-05-23 DIAGNOSIS — K219 Gastro-esophageal reflux disease without esophagitis: Secondary | ICD-10-CM | POA: Diagnosis not present

## 2019-05-23 DIAGNOSIS — Z944 Liver transplant status: Secondary | ICD-10-CM | POA: Diagnosis not present

## 2019-05-23 DIAGNOSIS — D509 Iron deficiency anemia, unspecified: Secondary | ICD-10-CM | POA: Diagnosis not present

## 2019-05-27 ENCOUNTER — Telehealth: Payer: Self-pay

## 2019-05-27 DIAGNOSIS — Z944 Liver transplant status: Secondary | ICD-10-CM

## 2019-05-27 NOTE — Telephone Encounter (Signed)
Pt left a vm msg requesting a referral for [facility]. No other details given in msg. Pls advise, thanks.

## 2019-05-28 NOTE — Telephone Encounter (Signed)
Routing to [place] as FYI, I am not familiar with this practice?  Let me know if alternate order needed or any other clarification can ask the patient.

## 2019-05-29 NOTE — Telephone Encounter (Signed)
I spoke with patient he wants to go to [facility] in [place] I am getting information from them and sending referral - CF

## 2019-06-06 ENCOUNTER — Other Ambulatory Visit: Payer: Self-pay | Admitting: Osteopathic Medicine

## 2019-06-06 DIAGNOSIS — I10 Essential (primary) hypertension: Secondary | ICD-10-CM

## 2019-06-06 NOTE — Telephone Encounter (Signed)
Forwarding medication refill request to PCP for review. 

## 2019-06-10 DIAGNOSIS — D509 Iron deficiency anemia, unspecified: Secondary | ICD-10-CM | POA: Diagnosis not present

## 2019-06-10 DIAGNOSIS — L570 Actinic keratosis: Secondary | ICD-10-CM | POA: Diagnosis not present

## 2019-06-10 DIAGNOSIS — C4441 Basal cell carcinoma of skin of scalp and neck: Secondary | ICD-10-CM | POA: Diagnosis not present

## 2019-06-10 DIAGNOSIS — Z85820 Personal history of malignant melanoma of skin: Secondary | ICD-10-CM | POA: Diagnosis not present

## 2019-06-10 DIAGNOSIS — Z859 Personal history of malignant neoplasm, unspecified: Secondary | ICD-10-CM | POA: Diagnosis not present

## 2019-06-10 DIAGNOSIS — Z08 Encounter for follow-up examination after completed treatment for malignant neoplasm: Secondary | ICD-10-CM | POA: Diagnosis not present

## 2019-06-10 DIAGNOSIS — I10 Essential (primary) hypertension: Secondary | ICD-10-CM | POA: Diagnosis not present

## 2019-06-11 LAB — CBC (INCLUDES DIFF/PLT) WITH PATHOLOGIST REVIEW
Absolute Monocytes: 812 cells/uL (ref 200–950)
Basophils Absolute: 82 cells/uL (ref 0–200)
Basophils Relative: 1 %
Eosinophils Absolute: 180 cells/uL (ref 15–500)
Eosinophils Relative: 2.2 %
HCT: 39 % (ref 38.5–50.0)
Hemoglobin: 12.3 g/dL — ABNORMAL LOW (ref 13.2–17.1)
Lymphs Abs: 1656 cells/uL (ref 850–3900)
MCH: 27.2 pg (ref 27.0–33.0)
MCHC: 31.5 g/dL — ABNORMAL LOW (ref 32.0–36.0)
MCV: 86.3 fL (ref 80.0–100.0)
MPV: 11.8 fL (ref 7.5–12.5)
Monocytes Relative: 9.9 %
Neutro Abs: 5469 cells/uL (ref 1500–7800)
Neutrophils Relative %: 66.7 %
Platelets: 224 10*3/uL (ref 140–400)
RBC: 4.52 10*6/uL (ref 4.20–5.80)
RDW: 18 % — ABNORMAL HIGH (ref 11.0–15.0)
Total Lymphocyte: 20.2 %
WBC: 8.2 10*3/uL (ref 3.8–10.8)

## 2019-06-11 LAB — COMPLETE METABOLIC PANEL WITH GFR
AG Ratio: 1.4 (calc) (ref 1.0–2.5)
ALT: 21 U/L (ref 9–46)
AST: 19 U/L (ref 10–35)
Albumin: 4.1 g/dL (ref 3.6–5.1)
Alkaline phosphatase (APISO): 60 U/L (ref 35–144)
BUN/Creatinine Ratio: 22 (calc) (ref 6–22)
BUN: 30 mg/dL — ABNORMAL HIGH (ref 7–25)
CO2: 21 mmol/L (ref 20–32)
Calcium: 8.9 mg/dL (ref 8.6–10.3)
Chloride: 109 mmol/L (ref 98–110)
Creat: 1.35 mg/dL — ABNORMAL HIGH (ref 0.70–1.25)
GFR, Est African American: 63 mL/min/{1.73_m2} (ref >=60)
GFR, Est Non African American: 55 mL/min/{1.73_m2} — ABNORMAL LOW (ref >=60)
Globulin: 3 g/dL (calc) (ref 1.9–3.7)
Glucose, Bld: 100 mg/dL — ABNORMAL HIGH (ref 65–99)
Potassium: 4.5 mmol/L (ref 3.5–5.3)
Sodium: 140 mmol/L (ref 135–146)
Total Bilirubin: 0.4 mg/dL (ref 0.2–1.2)
Total Protein: 7.1 g/dL (ref 6.1–8.1)

## 2019-06-11 LAB — IRON,TIBC AND FERRITIN PANEL
%SAT: 9 % (calc) — ABNORMAL LOW (ref 20–48)
Ferritin: 15 ng/mL — ABNORMAL LOW (ref 24–380)
Iron: 33 ug/dL — ABNORMAL LOW (ref 50–180)
TIBC: 365 mcg/dL (calc) (ref 250–425)

## 2019-07-11 DIAGNOSIS — K2950 Unspecified chronic gastritis without bleeding: Secondary | ICD-10-CM | POA: Diagnosis not present

## 2019-07-11 DIAGNOSIS — D509 Iron deficiency anemia, unspecified: Secondary | ICD-10-CM | POA: Diagnosis not present

## 2019-07-11 DIAGNOSIS — K449 Diaphragmatic hernia without obstruction or gangrene: Secondary | ICD-10-CM | POA: Diagnosis not present

## 2019-07-11 DIAGNOSIS — Z87891 Personal history of nicotine dependence: Secondary | ICD-10-CM | POA: Diagnosis not present

## 2019-07-11 DIAGNOSIS — Z980 Intestinal bypass and anastomosis status: Secondary | ICD-10-CM | POA: Diagnosis not present

## 2019-07-11 DIAGNOSIS — K640 First degree hemorrhoids: Secondary | ICD-10-CM | POA: Diagnosis not present

## 2019-08-29 DIAGNOSIS — K91850 Pouchitis: Secondary | ICD-10-CM | POA: Diagnosis not present

## 2019-08-29 DIAGNOSIS — R634 Abnormal weight loss: Secondary | ICD-10-CM | POA: Diagnosis not present

## 2019-10-03 ENCOUNTER — Other Ambulatory Visit: Payer: Self-pay

## 2019-10-03 ENCOUNTER — Emergency Department (INDEPENDENT_AMBULATORY_CARE_PROVIDER_SITE_OTHER): Payer: Medicare Other

## 2019-10-03 ENCOUNTER — Emergency Department
Admission: EM | Admit: 2019-10-03 | Discharge: 2019-10-03 | Disposition: A | Discharge status: Home / Self Care | Payer: Medicare Other | Source: Home / Self Care | Attending: Family Medicine | Admitting: Family Medicine

## 2019-10-03 DIAGNOSIS — J40 Bronchitis, not specified as acute or chronic: Secondary | ICD-10-CM

## 2019-10-03 DIAGNOSIS — R05 Cough: Secondary | ICD-10-CM

## 2019-10-03 DIAGNOSIS — R509 Fever, unspecified: Secondary | ICD-10-CM

## 2019-10-03 DIAGNOSIS — R0600 Dyspnea, unspecified: Secondary | ICD-10-CM

## 2019-10-03 DIAGNOSIS — R0609 Other forms of dyspnea: Secondary | ICD-10-CM

## 2019-10-03 LAB — COMPLETE METABOLIC PANEL WITH GFR
AG Ratio: 1.1 (calc) (ref 1.0–2.5)
ALT: 15 U/L (ref 9–46)
AST: 23 U/L (ref 10–35)
Albumin: 3.6 g/dL (ref 3.6–5.1)
Alkaline phosphatase (APISO): 82 U/L (ref 35–144)
BUN/Creatinine Ratio: 14 (calc) (ref 6–22)
BUN: 30 mg/dL — ABNORMAL HIGH (ref 7–25)
CO2: 17 mmol/L — ABNORMAL LOW (ref 20–32)
Calcium: 8.3 mg/dL — ABNORMAL LOW (ref 8.6–10.3)
Chloride: 101 mmol/L (ref 98–110)
Creat: 2.08 mg/dL — ABNORMAL HIGH (ref 0.70–1.25)
GFR, Est African American: 38 mL/min/{1.73_m2} — ABNORMAL LOW (ref >=60)
GFR, Est Non African American: 32 mL/min/{1.73_m2} — ABNORMAL LOW (ref >=60)
Globulin: 3.2 g/dL (calc) (ref 1.9–3.7)
Glucose, Bld: 104 mg/dL — ABNORMAL HIGH (ref 65–99)
Potassium: 4.6 mmol/L (ref 3.5–5.3)
Sodium: 129 mmol/L — ABNORMAL LOW (ref 135–146)
Total Bilirubin: 0.7 mg/dL (ref 0.2–1.2)
Total Protein: 6.8 g/dL (ref 6.1–8.1)

## 2019-10-03 LAB — POCT CBC W AUTO DIFF (K'VILLE URGENT CARE)

## 2019-10-03 LAB — POC SARS CORONAVIRUS 2 AG -  ED: SARS Coronavirus 2 Ag: NEGATIVE

## 2019-10-03 MED ORDER — DOXYCYCLINE HYCLATE 100 MG PO CAPS: 100.0000 mg | ORAL_CAPSULE | Freq: Two times a day (BID) | ORAL | 0 refills | Status: DC

## 2019-10-03 MED ORDER — AMOXICILLIN-POT CLAVULANATE 875-125 MG PO TABS: 1.0000 | ORAL_TABLET | Freq: Two times a day (BID) | ORAL | 0 refills | Status: DC

## 2019-10-03 NOTE — ED Provider Notes (Signed)
[facility]    CSN: [id number] Arrival date & time: 10/03/19  1247      History   Chief Complaint Chief Complaint  Patient presents with  . Cough  . Diarrhea    HPI Paul Fuentes is a 63 y.o. male.   Patient reports that he developed a cold about three weeks ago, and a mild cough has persisted.  One week ago he developed nausea/vomiting and diarrhea, and loose stools have persisted.  This week he developed increased shortness of breath, with a fever to 100+ last night.  He notes he discontinued his amlodipine four days ago because his BP was lower than usual (100/60).  A COVID19 test done six days ago came back negative.   He denies chest tightness and changes in taste/smell. His multiple medical problems include liver transplant, ulcerative colitis, and a colectomy.    The history is provided by the patient.    Past Medical History:  Diagnosis Date  . 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  History of unprovoked distal left lower extra may DVT in 2000, 6 weeks of enoxaparin.  History of autoimmune hepatitis and liver transplant in 1884, complicated by lower extremity DVT.  History of ulcerative colitis, status post colectomy 2001 Hematology consult 03/2016. Regarding recommendation for treatment of her current pains, patient with multiple DVT, vascular surgery requested second opinion. Hematology noted would be reasonable to recommend anticoagulation but patient was not interested in this. Was encouraged to consider prophylactic anticoagulation in increased risk areas such as surgery. Recommended rivaroxaban 10 mg daily or apixiban 2.5 mg twice a day 3-5 days before a procedure and up to one month after the procedure. Blood pressure 169/113 per records in June 2017  . Hypertension   . Liver transplant recipient [facility]) 06/29/2016  . Ulcerative colitis [facility])     Patient Active  Problem List   Diagnosis Date Noted  . Right knee pain 03/28/2017  . Dyspnea on exertion 03/27/2017  . 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 Left leg DVT ([place]) 08/30/2016  . Cardiac risk counseling 07/13/2016  . 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 Essential hypertension 06/29/2016  . History of DVT (deep vein thrombosis) 06/29/2016  . Liver transplant recipient [facility]) 06/29/2016  . History of total colectomy 06/29/2016    Past Surgical History:  Procedure Laterality Date  . APPENDECTOMY    . COLECTOMY  2001  . LIVER TRANSPLANT  05/2007  . REPLACEMENT TOTAL KNEE  09/2011       Home Medications    Prior to Admission medications   Medication Sig Start Date End Date Taking? Authorizing Provider  amLODipine-benazepril (LOTREL) 5-20 MG capsule TAKE 1 CAPSULE BY MOUTH DAILY 06/06/19   Emeterio Reeve, DO  amoxicillin-clavulanate (AUGMENTIN) 875-125 MG tablet Take 1 tablet by mouth 2 (two) times daily. Take with food 10/03/19   Kandra Nicolas, MD  apixaban (ELIQUIS) 5 MG TABS tablet Take 1 tablet (5 mg total) by mouth 2 (two) times daily. 10/29/18   Emeterio Reeve, DO  clonazePAM (KLONOPIN) 0.5 MG tablet Take 0.5-1 tablets (0.25-0.5 mg total) by mouth at bedtime. 04/24/19   Emeterio Reeve, DO  doxycycline (VIBRAMYCIN) 100 MG capsule Take 1 capsule (100 mg total) by mouth 2 (two) times daily. Take with food. 10/03/19   Kandra Nicolas, MD  mycophenolate (CELLCEPT) 500 MG tablet Take 500  mg by mouth daily.    [provider]  omeprazole (PRILOSEC) 20 MG capsule Take 20 mg by mouth 2 (two) times daily. 06/21/16   [provider]  predniSONE (DELTASONE) 5 MG tablet Take 5 mg by mouth daily with breakfast.    [provider]  tacrolimus (PROGRAF) 1 MG capsule Take 1 mg by mouth 2 (two) times daily.    [provider]    Family History Family History  Problem Relation Age of Onset  . Ulcerative colitis Mother   . Colon  cancer Father     Social History Social History   Tobacco Use  . Smoking status: Former Games developer  . Smokeless tobacco: Never Used  Substance Use Topics  . Alcohol use: No    Comment: Occasional  . Drug use: No     Allergies   Patient has no known allergies.   Review of Systems Review of Systems + sore throat + cough No pleuritic pain No wheezing No nasal congestion No post-nasal drainage No sinus pain/pressure No itchy/red eyes No earache No hemoptysis + SOB + fever, + chills + nausea, resolved + vomiting, resolved No abdominal pain + diarrhea No urinary symptoms No skin rash + fatigue No myalgias No headache    Physical Exam Triage Vital Signs ED Triage Vitals  Enc Vitals Group     BP 10/03/19 1312 115/83     Pulse Rate 10/03/19 1312 (!) 124     Resp 10/03/19 1312 (!) 22     Temp 10/03/19 1312 98 F (36.7 C)     Temp Source 10/03/19 1312 Tympanic     SpO2 10/03/19 1312 93 %     Weight 10/03/19 1314 215 lb (97.5 kg)     Height 10/03/19 1314 5\' 8"  (1.727 m)     Head Circumference --      Peak Flow --      Pain Score 10/03/19 1313 5     Pain Loc --      Pain Edu? --      Excl. in GC? --    No data found.  Updated Vital Signs BP 115/83 (BP Location: Right Arm)   Pulse (!) 124   Temp 98 F (36.7 C) (Tympanic)   Resp (!) 22   Ht 5\' 8"  (1.727 m)   Wt 97.5 kg   SpO2 93%   BMI 32.69 kg/m   Visual Acuity Right Eye Distance:   Left Eye Distance:   Bilateral Distance:    Right Eye Near:   Left Eye Near:    Bilateral Near:     Physical Exam Nursing notes and Vital Signs reviewed. Appearance:  Patient appears stated age, and in no acute distress.  He is alert and oriented.  Eyes:  Pupils are equal, round, and reactive to light and accomodation.  Extraocular movement is intact.  Conjunctivae are not inflamed  Ears:  Canals normal.  Tympanic membranes normal.  Nose:  Mildly congested turbinates.  No sinus tenderness.  Pharynx:   Normal Neck:  Supple.  No adenopathy  Lungs:  Bibasilar posterior rhonchi.   Breath sounds are equal.  Moving air well.  No respiratory distress. Heart:  Regular rate and rhythm without murmurs, rubs, or gallops.  Abdomen:  Nontender without masses or hepatosplenomegaly.  Bowel sounds are present.  No CVA or flank tenderness.  Extremities:  No edema.  Skin:  No rash present.   UC Treatments / Results  Labs (all labs ordered are listed,  but only abnormal results are displayed) Labs Reviewed  COMPLETE METABOLIC PANEL WITH GFR  POCT CBC W AUTO DIFF ([facility]):  WBC 14.1; LY 50.3; MO 11.1; GR 38.6; Hgb 14.9; Platelets 185   POC SARS CORONAVIRUS 2 AG -  ED negative      EKG   Radiology DG Chest 2 View  Result Date: 10/03/2019 CLINICAL DATA:  Persistent cough, fever. EXAM: CHEST - 2 VIEW COMPARISON:  July 02, 2017. FINDINGS: The heart size and mediastinal contours are within normal limits. Both lungs are clear. The visualized skeletal structures are unremarkable. IMPRESSION: No active cardiopulmonary disease. Electronically Signed   By: Lupita RaiderJames  Green Jr M.D.   On: 10/03/2019 14:42    Procedures Procedures (including critical care time)  Medications Ordered in UC Medications - No data to display  Initial Impression / Assessment and Plan / UC Course  I have reviewed the triage vital signs and the nursing notes.  Pertinent labs & imaging results that were available during my care of the patient were reviewed by me and considered in my medical decision making (see chart for details).    Negative chest X-ray reassuring.  Note mildly elevated WBC (14.1). CMP pending. Recommend oral rehydration solution such as Pedialyte for about 12 to 18 hours then gradually advance diet. Begin Augmentin and doxycycline. Followup with Family Doctor in about 4 days.   Final Clinical Impressions(s) / UC Diagnoses   Final diagnoses:  Dyspnea on exertion  Bronchitis     Discharge  Instructions     Take plain guaifenesin (such as Mucinex) with plenty of water, for cough and congestion.  Try warm salt water gargles for sore throat.  Stop all antihistamines for now, and other non-prescription cough/cold preparations. May take Delsym Cough Suppressant at bedtime for nighttime cough.   If symptoms become significantly worse during the night or over the weekend, proceed to the local emergency room.     ED Prescriptions    Medication Sig Dispense Auth. Provider   amoxicillin-clavulanate (AUGMENTIN) 875-125 MG tablet Take 1 tablet by mouth 2 (two) times daily. Take with food 14 tablet Lattie HawBeese, Wilfrido Luedke A, MD   doxycycline (VIBRAMYCIN) 100 MG capsule Take 1 capsule (100 mg total) by mouth 2 (two) times daily. Take with food. 14 capsule Lattie HawBeese, Jeriyah Granlund A, MD        Lattie HawBeese, Jovonte Commins A, MD 10/08/19 [phone]

## 2019-10-03 NOTE — ED Triage Notes (Signed)
Cold the week after thanksgiving.  Has settled in his chest N/V/D since last Friday, SOB.  Covid test last Saturday -Negative.

## 2019-10-03 NOTE — Discharge Instructions (Addendum)
Take plain guaifenesin (such as Mucinex) with plenty of water, for cough and congestion.  Try warm salt water gargles for sore throat.  Stop all antihistamines for now, and other non-prescription cough/cold preparations. May take Delsym Cough Suppressant at bedtime for nighttime cough.   If symptoms become significantly worse during the night or over the weekend, proceed to the local emergency room.

## 2019-10-05 ENCOUNTER — Telehealth: Payer: Self-pay | Admitting: Emergency Medicine

## 2019-10-05 NOTE — Telephone Encounter (Signed)
Per Dr.Beese; called patient who states is feeling better; told him labs reflect out of range values that could be from his diarrhea, but he should contact pcp tomorrow to follow. He understands and will comply.

## 2019-10-07 NOTE — Telephone Encounter (Signed)
COVID TEST negative

## 2019-10-08 LAB — POCT CBC W AUTO DIFF (K'VILLE URGENT CARE)

## 2019-10-13 ENCOUNTER — Telehealth: Payer: Self-pay

## 2019-10-13 NOTE — Telephone Encounter (Signed)
Please schedule in office, 40 mins if possible otherwise whenever is fine

## 2019-10-13 NOTE — Telephone Encounter (Signed)
Pt's sister called with concerns regarding pt's health. As per Horris Latino pt is still not doing well. Pt had a liver transplant. Pt had 3 Covid testing with negative results. Pt continues to be symptomatic with SOB. Horris Latino stated pt does have a [facility] appt on 10/15/19 with provider. His creatinine level was 2.0 when checked at Urgent care. She stated that getting an appt with provider was challenging. She is requesting for pt come in the office for appt. She wants provider to be aware she is a retired Marine scientist and knows that something is not right with her brother. Pls advise. Thanks.

## 2019-10-14 NOTE — Telephone Encounter (Signed)
Appointment has been made. No further questions at this time. Patient is to come in office tomorrow.

## 2019-10-15 ENCOUNTER — Ambulatory Visit: Payer: Medicare Other | Admitting: Osteopathic Medicine

## 2019-10-15 ENCOUNTER — Encounter: Payer: Self-pay | Admitting: Osteopathic Medicine

## 2019-10-15 ENCOUNTER — Other Ambulatory Visit: Payer: Self-pay

## 2019-10-15 ENCOUNTER — Ambulatory Visit (INDEPENDENT_AMBULATORY_CARE_PROVIDER_SITE_OTHER): Payer: Medicare Other | Admitting: Osteopathic Medicine

## 2019-10-15 VITALS — BP 129/92 | HR 115 | Temp 97.4°F | Wt 222.1 lb

## 2019-10-15 DIAGNOSIS — Z8679 Personal history of other diseases of the circulatory system: Secondary | ICD-10-CM

## 2019-10-15 DIAGNOSIS — R0600 Dyspnea, unspecified: Secondary | ICD-10-CM

## 2019-10-15 DIAGNOSIS — G9331 Postviral fatigue syndrome: Secondary | ICD-10-CM

## 2019-10-15 DIAGNOSIS — G933 Postviral fatigue syndrome: Secondary | ICD-10-CM

## 2019-10-15 DIAGNOSIS — R058 Other specified cough: Secondary | ICD-10-CM

## 2019-10-15 DIAGNOSIS — R05 Cough: Secondary | ICD-10-CM

## 2019-10-15 DIAGNOSIS — R0609 Other forms of dyspnea: Secondary | ICD-10-CM

## 2019-10-15 MED ORDER — BUDESONIDE-FORMOTEROL FUMARATE 80-4.5 MCG/ACT IN AERO: 2.0000 | INHALATION_SPRAY | Freq: Two times a day (BID) | RESPIRATORY_TRACT | 0 refills | Status: AC

## 2019-10-15 MED ORDER — PREDNISONE 10 MG PO TABS: ORAL_TABLET | ORAL | 0 refills | Status: AC

## 2019-10-15 MED ORDER — GUAIFENESIN-CODEINE 100-10 MG/5ML PO SOLN: 5.0000 mL | Freq: Four times a day (QID) | ORAL | 0 refills | Status: AC | PRN

## 2019-10-15 NOTE — Patient Instructions (Addendum)
Plan:  With trouble breathing on exertion, cough and fatigue, the most likely cause is postviral fatigue syndrome + postviral cough syndrome. These conditions can usually improve slowly even if we do nothing, but I will usually prescribe an inhaler and a steroid burst/taper to help with symptoms. Prednisone to pharmacy, sample given today for inhaler. I also sent some cough medicine. Hold your usual 5 mg dose prednisone for now, when you are done with the 10 mg taper can restart the 5 mg dose daily.   Your oxygen levels stayed good with walking, and recent chest Xray was normal, which is reassuring for no serious lung problem. You do not have other symptoms or exam findings consistent with heart failure or other issues, and EKG looks normal, which is also reassuring.   I'd like to reassess the labs, especially kidney function. I'd like to check some other things on blood work, especially make sure your white blood cells are looking okay, there is no anemia, thyroid is normal, and labs negative for heart failure.   If the medications do not help, we should get you set up for a few other things: First would be lung function test, even though you are low risk for COPD your smoking history may have predisposed to some lung damage - we can do this test in the office. We might consider getting you set up for a stress test with a cardiologist, or a consult with a lung doctor.   If your symptoms get worse, especially if you are struggling to breathe, if you have chest pain or heart palpitations, if you have severe swelling in legs/feet, if you feel like you are about to pass out or if you do pass out, please call 911 or seek emergency medical attention right away!

## 2019-10-15 NOTE — Progress Notes (Signed)
HPI: Paul Fuentes is a 63 y.o. male who  has a past medical history of History of DVT (deep vein thrombosis) (06/29/2016), Hypertension, Liver transplant recipient [facility](HCC) (06/29/2016), and Ulcerative colitis (HCC).  [facility], 10/15/19,  for chief complaint of:  Shortness of breath   Attributes onset of symptoms to when he caught illness from his young granddaughter few months ago.  Coughing w/ exertion, but ok when he's relaxed. SaO2 normal as below. No SOB at rest.    Liver transplant, has colectomy bag s/p colectomy for UC. Sister concerned re: creatinine elevated.   [facility] Creat 2.08, GFR 32. This is a bit below baseline (last check 05/2019 was 1.35, prior to that 03/2019 was WNL 1.18)  Stopped BP meds d/t orthostatic hypotension      At today's visit 10/15/19 ... PMH, PSH, FH reviewed and updated as needed.  Current medication list and allergy/intolerance hx reviewed and updated as needed. (See remainder of HPI, ROS, Phys Exam below)   No results found.  No results found for this or any previous visit (from the past 72 hour(s)).   6-min walk Pt hd to stop after 3 minutes d/t fatigue SaO2 never went below 96%  EKG NSR< low voltage, no change from previous      ASSESSMENT/PLAN: The primary encounter diagnosis was Dyspnea on exertion. Diagnoses of History of chronic hypertension, Postviral fatigue syndrome, and Post-viral cough syndrome were also pertinent to this visit.   Orders Placed This Encounter  Procedures  . COMPLETE METABOLIC PANEL WITH GFR  . CBC W/ DIFF.  . TSH  . B Nat Peptide  . EKG 12-Lead     Meds ordered this encounter  Medications  . predniSONE (DELTASONE) 10 MG tablet    Sig: Take 5 tablets (50 mg total) by mouth daily with breakfast for 2 days, THEN 4 tablets (40 mg total) daily with breakfast for 2 days, THEN 3 tablets (30 mg total) daily with breakfast for 2 days, THEN  2 tablets (20 mg total) daily with breakfast for 2 days, THEN 1 tablet (10 mg total) daily with breakfast for 2 days.    Dispense:  30 tablet    Refill:  0  . budesonide-formoterol (SYMBICORT) 80-4.5 MCG/ACT inhaler    Sig: Inhale 2 puffs into the lungs 2 (two) times daily.    Dispense:  1 Inhaler    Refill:  0  . guaiFENesin-codeine 100-10 MG/5ML syrup    Sig: Take 5-10 mLs by mouth every 6 (six) hours as needed for cough.    Dispense:  180 mL    Refill:  0    Patient Instructions  Plan:  With trouble breathing on exertion, cough and fatigue, the most likely cause is postviral fatigue syndrome + postviral cough syndrome. These conditions can usually improve slowly even if we do nothing, but I will usually prescribe an inhaler and a steroid burst/taper to help with symptoms. Prednisone to pharmacy, sample given today for inhaler. I also sent some cough medicine. Hold your usual 5 mg dose prednisone for now, when you are done with the 10 mg taper can restart the 5 mg dose daily.   Your oxygen levels stayed good with walking, and recent chest Xray was normal, which is reassuring for no serious lung problem. You do not have other symptoms or exam findings consistent with heart failure or other issues, and EKG looks normal, which is also reassuring.  I'd like to reassess the labs, especially kidney function. I'd like to check some other things on blood work, especially make sure your white blood cells are looking okay, there is no anemia, thyroid is normal, and labs negative for heart failure.   If the medications do not help, we should get you set up for a few other things: First would be lung function test, even though you are low risk for COPD your smoking history may have predisposed to some lung damage - we can do this test in the office. We might consider getting you set up for a stress test with a cardiologist, or a consult with a lung doctor.   If your symptoms get worse, especially if  you are struggling to breathe, if you have chest pain or heart palpitations, if you have severe swelling in legs/feet, if you feel like you are about to pass out or if you do pass out, please call 911 or seek emergency medical attention right away!        Follow-up plan: Return in about 2 weeks (around 10/29/2019) for lung function test if not better with steroids and inhaler. Call sooner if needed! .                                                 ################################################# ################################################# ################################################# #################################################    Current Meds  Medication Sig  . apixaban (ELIQUIS) 5 MG TABS tablet Take 1 tablet (5 mg total) by mouth 2 (two) times daily.  . clonazePAM (KLONOPIN) 0.5 MG tablet Take 0.5-1 tablets (0.25-0.5 mg total) by mouth at bedtime.  . mycophenolate (CELLCEPT) 500 MG tablet Take 500 mg by mouth daily.  Marland Kitchen omeprazole (PRILOSEC) 20 MG capsule Take 20 mg by mouth 2 (two) times daily.  . predniSONE (DELTASONE) 5 MG tablet Take 5 mg by mouth daily with breakfast.  . tacrolimus (PROGRAF) 1 MG capsule Take 1 mg by mouth 2 (two) times daily.  . [DISCONTINUED] amLODipine-benazepril (LOTREL) 5-20 MG capsule TAKE 1 CAPSULE BY MOUTH DAILY    No Known Allergies     Review of Systems:  Constitutional: +recent illness improved, fatigue is slowly improving but still significantly impacting QOL   HEENT: No  headache, no vision change  Cardiac: No  chest pain, No  pressure, No palpitations  Respiratory:  +shortness of breath. +Cough  Gastrointestinal: No  abdominal pain  Musculoskeletal: No new myalgia/arthralgia  Hem/Onc: No  easy bruising/bleeding, No  abnormal lumps/bumps  Neurologic: No  weakness, No  Dizziness  Psychiatric: No  concerns with depression, No  concerns with anxiety  Exam:  BP (!)  129/92 (BP Location: Left Arm, Patient Position: Sitting, Cuff Size: Large)   Pulse (!) 115   Temp (!) 97.4 F (36.3 C) (Oral)   Wt 222 lb 1.9 oz (100.8 kg)   SpO2 97%   BMI 33.77 kg/m   Constitutional: VS see above. General Appearance: alert, well-developed, well-nourished, NAD  Eyes: Normal lids and conjunctive, non-icteric sclera  Ears, Nose, Mouth, Throat: MMM, Normal external inspection ears/nares/mouth/lips/gums.  Neck: No masses, trachea midline.   Respiratory: Normal respiratory effort. no wheeze, no rhonchi, no rales  Cardiovascular: S1/S2 normal, no murmur, no rub/gallop auscultated. RRR.   Musculoskeletal: Gait normal. Symmetric and independent movement of all extremities  Abdominal: non-tender, non-distended, no appreciable organomegaly, neg Murphy's, BS WNLx4  Neurological: Normal balance/coordination. No tremor.  Skin: warm, dry, intact.   Psychiatric: Normal judgment/insight. Normal mood and affect. Oriented x3.       Visit summary with medication list and pertinent instructions was printed for patient to review, patient was advised to alert [place] if any updates are needed. All questions at time of visit were answered - patient instructed to contact office with any additional concerns. ER/RTC precautions were reviewed with the patient and understanding verbalized.   Note: Total time spent 40 minutes, greater than 50% of the visit was spent face-to-face counseling and coordinating care for the following: The primary encounter diagnosis was Dyspnea on exertion. Diagnoses of History of chronic hypertension, Postviral fatigue syndrome, and Post-viral cough syndrome were also pertinent to this visit.Marland Kitchen  Please note: voice recognition software was used to produce this document, and typos may escape review. Please contact Dr. Sheppard Coil for any needed clarifications.    Follow up plan: Return in about 2 weeks (around 10/29/2019) for lung function test if not better with  steroids and inhaler. Call sooner if needed! Marland Kitchen

## 2019-10-16 LAB — COMPLETE METABOLIC PANEL WITH GFR
AG Ratio: 1 (calc) (ref 1.0–2.5)
ALT: 17 U/L (ref 9–46)
AST: 22 U/L (ref 10–35)
Albumin: 3.4 g/dL — ABNORMAL LOW (ref 3.6–5.1)
Alkaline phosphatase (APISO): 77 U/L (ref 35–144)
BUN/Creatinine Ratio: 16 (calc) (ref 6–22)
BUN: 27 mg/dL — ABNORMAL HIGH (ref 7–25)
CO2: 20 mmol/L (ref 20–32)
Calcium: 8.8 mg/dL (ref 8.6–10.3)
Chloride: 108 mmol/L (ref 98–110)
Creat: 1.66 mg/dL — ABNORMAL HIGH (ref 0.70–1.25)
GFR, Est African American: 49 mL/min/{1.73_m2} — ABNORMAL LOW (ref >=60)
GFR, Est Non African American: 43 mL/min/{1.73_m2} — ABNORMAL LOW (ref >=60)
Globulin: 3.5 g/dL (calc) (ref 1.9–3.7)
Glucose, Bld: 101 mg/dL — ABNORMAL HIGH (ref 65–99)
Potassium: 4.2 mmol/L (ref 3.5–5.3)
Sodium: 138 mmol/L (ref 135–146)
Total Bilirubin: 0.6 mg/dL (ref 0.2–1.2)
Total Protein: 6.9 g/dL (ref 6.1–8.1)

## 2019-10-16 LAB — TSH: TSH: 2.39 mIU/L (ref 0.40–4.50)

## 2019-10-16 LAB — CBC WITH DIFFERENTIAL/PLATELET
Absolute Monocytes: 910 cells/uL (ref 200–950)
Basophils Absolute: 89 cells/uL (ref 0–200)
Basophils Relative: 0.8 %
Eosinophils Absolute: 100 cells/uL (ref 15–500)
Eosinophils Relative: 0.9 %
HCT: 41.7 % (ref 38.5–50.0)
Hemoglobin: 14.2 g/dL (ref 13.2–17.1)
Lymphs Abs: 3330 cells/uL (ref 850–3900)
MCH: 31.1 pg (ref 27.0–33.0)
MCHC: 34.1 g/dL (ref 32.0–36.0)
MCV: 91.4 fL (ref 80.0–100.0)
MPV: 11 fL (ref 7.5–12.5)
Monocytes Relative: 8.2 %
Neutro Abs: 6671 cells/uL (ref 1500–7800)
Neutrophils Relative %: 60.1 %
Platelets: 168 10*3/uL (ref 140–400)
RBC: 4.56 10*6/uL (ref 4.20–5.80)
RDW: 13.5 % (ref 11.0–15.0)
Total Lymphocyte: 30 %
WBC: 11.1 10*3/uL — ABNORMAL HIGH (ref 3.8–10.8)

## 2019-10-16 LAB — BRAIN NATRIURETIC PEPTIDE: Brain Natriuretic Peptide: 40 pg/mL (ref <100)

## 2019-10-30 ENCOUNTER — Other Ambulatory Visit: Payer: Self-pay

## 2019-10-30 ENCOUNTER — Ambulatory Visit (INDEPENDENT_AMBULATORY_CARE_PROVIDER_SITE_OTHER): Payer: Medicare Other | Admitting: Medical-Surgical

## 2019-10-30 VITALS — BP 153/102 | HR 81 | Ht 68.0 in | Wt 228.0 lb

## 2019-10-30 DIAGNOSIS — R0600 Dyspnea, unspecified: Secondary | ICD-10-CM

## 2019-10-30 DIAGNOSIS — R197 Diarrhea, unspecified: Secondary | ICD-10-CM | POA: Diagnosis not present

## 2019-10-30 DIAGNOSIS — R0609 Other forms of dyspnea: Secondary | ICD-10-CM

## 2019-10-30 DIAGNOSIS — R05 Cough: Secondary | ICD-10-CM | POA: Diagnosis not present

## 2019-10-30 DIAGNOSIS — I10 Essential (primary) hypertension: Secondary | ICD-10-CM | POA: Diagnosis not present

## 2019-10-30 DIAGNOSIS — Z944 Liver transplant status: Secondary | ICD-10-CM | POA: Diagnosis not present

## 2019-10-30 DIAGNOSIS — R058 Other specified cough: Secondary | ICD-10-CM

## 2019-10-30 DIAGNOSIS — K900 Celiac disease: Secondary | ICD-10-CM | POA: Diagnosis not present

## 2019-10-30 MED ORDER — ALBUTEROL SULFATE HFA 108 (90 BASE) MCG/ACT IN AERS
2.0000 | INHALATION_SPRAY | Freq: Once | RESPIRATORY_TRACT | Status: AC
  Administered 2019-10-30: 2 via RESPIRATORY_TRACT

## 2019-10-30 NOTE — Progress Notes (Signed)
Patient presents to clinic for a pre and post spirometry test. He reports that he has been taking Amoxicillin for 3 days from an old prescription and is feeling some better. He also states " I am not sure why this is being preformed and there is something else wrong with me". Provider is aware.

## 2019-10-30 NOTE — Progress Notes (Signed)
Paul Fuentes is a 64 y.o. male presenting to [facility] today, 10/30/19, seeking care for the following:  Chronic cough- continues to have frequent cough, productive of yellow sputum.  Denies nasal drainage and other upper respiratory symptoms.  Reports he has occasional wheezing and is able to "hear myself exhale when I lie down at night".  Found an old prescription of amoxicillin and began taking that 3 days ago.  Reports this is helping him feel better and requesting another round of antibiotics be prescribed. No fever, chills.  Dyspnea on exertion- continues to experience shortness of breath when walking short distances.   Assessment & Plan:  Dyspnea on exertion most likely post-viral cough syndrome No indication for antibiotic therapy at this time.  Spirometry performed today-normal result.  Prior chest x-ray clear. Symptoms have not worsened. Referral to pulmonology. Consider referral to cardiology but lung etiology seems more likely at this point. ER precautions reviewed.  Hypertension Patient had stopped taking blood pressure medicine but reports he will be restarting his amlodipine-benazepril daily.  Follow-up instructions: Return if symptoms worsen or fail to improve.  Records reviewed (notes, test results, etc.): Prior encounter notes, prior imaging and test results.  BP (!) 153/102   Pulse 81   Ht 5\' 8"  (1.727 m)   Wt 228 lb (103.4 kg)   BMI 34.67 kg/m   Current Meds  Medication Sig  . apixaban (ELIQUIS) 5 MG TABS tablet Take 1 tablet (5 mg total) by mouth 2 (two) times daily.  . budesonide-formoterol (SYMBICORT) 80-4.5 MCG/ACT inhaler Inhale 2 puffs into the lungs 2 (two) times daily.  . clonazePAM (KLONOPIN) 0.5 MG tablet Take 0.5-1 tablets (0.25-0.5 mg total) by mouth at bedtime.  guaiFENesin-codeine 100-10 MG/5ML syrup Take 5-10 mLs by mouth every 6 (six) hours as needed for cough.  . mycophenolate (CELLCEPT) 500  MG tablet Take 500 mg by mouth daily.  Marland Kitchen omeprazole (PRILOSEC) 20 MG capsule Take 20 mg by mouth 2 (two) times daily.  . predniSONE (DELTASONE) 5 MG tablet Take 5 mg by mouth daily with breakfast.  . tacrolimus (PROGRAF) 1 MG capsule Take 1 mg by mouth 2 (two) times daily.    Marland Kitchen, DNP, APRN, FNP-BC [facility]

## 2019-11-07 ENCOUNTER — Encounter: Payer: Self-pay | Admitting: Osteopathic Medicine

## 2019-11-17 ENCOUNTER — Other Ambulatory Visit: Payer: Self-pay

## 2019-11-17 MED ORDER — APIXABAN 5 MG PO TABS: 5.0000 mg | ORAL_TABLET | Freq: Two times a day (BID) | ORAL | 3 refills | Status: AC

## 2019-11-20 DIAGNOSIS — Z944 Liver transplant status: Secondary | ICD-10-CM | POA: Diagnosis not present

## 2019-11-20 DIAGNOSIS — Z79899 Other long term (current) drug therapy: Secondary | ICD-10-CM | POA: Diagnosis not present

## 2019-11-20 DIAGNOSIS — Z4823 Encounter for aftercare following liver transplant: Secondary | ICD-10-CM | POA: Diagnosis not present

## 2019-11-20 DIAGNOSIS — K900 Celiac disease: Secondary | ICD-10-CM | POA: Diagnosis not present

## 2019-11-25 DIAGNOSIS — K219 Gastro-esophageal reflux disease without esophagitis: Secondary | ICD-10-CM | POA: Diagnosis not present

## 2019-11-25 DIAGNOSIS — Z944 Liver transplant status: Secondary | ICD-10-CM | POA: Diagnosis not present

## 2019-11-25 DIAGNOSIS — I10 Essential (primary) hypertension: Secondary | ICD-10-CM | POA: Diagnosis not present

## 2019-11-25 DIAGNOSIS — E669 Obesity, unspecified: Secondary | ICD-10-CM | POA: Diagnosis not present

## 2019-12-11 DIAGNOSIS — L570 Actinic keratosis: Secondary | ICD-10-CM | POA: Diagnosis not present

## 2019-12-11 DIAGNOSIS — K900 Celiac disease: Secondary | ICD-10-CM | POA: Diagnosis not present

## 2019-12-11 DIAGNOSIS — R197 Diarrhea, unspecified: Secondary | ICD-10-CM | POA: Diagnosis not present

## 2019-12-11 DIAGNOSIS — K219 Gastro-esophageal reflux disease without esophagitis: Secondary | ICD-10-CM | POA: Diagnosis not present

## 2019-12-11 DIAGNOSIS — Z85820 Personal history of malignant melanoma of skin: Secondary | ICD-10-CM | POA: Diagnosis not present

## 2019-12-11 DIAGNOSIS — L820 Inflamed seborrheic keratosis: Secondary | ICD-10-CM | POA: Diagnosis not present

## 2019-12-11 DIAGNOSIS — Z08 Encounter for follow-up examination after completed treatment for malignant neoplasm: Secondary | ICD-10-CM | POA: Diagnosis not present

## 2019-12-11 DIAGNOSIS — Z944 Liver transplant status: Secondary | ICD-10-CM | POA: Diagnosis not present

## 2019-12-16 DIAGNOSIS — Z944 Liver transplant status: Secondary | ICD-10-CM | POA: Diagnosis not present

## 2019-12-16 DIAGNOSIS — Z86718 Personal history of other venous thrombosis and embolism: Secondary | ICD-10-CM | POA: Diagnosis not present

## 2019-12-16 DIAGNOSIS — I10 Essential (primary) hypertension: Secondary | ICD-10-CM | POA: Diagnosis not present

## 2019-12-16 DIAGNOSIS — Z8719 Personal history of other diseases of the digestive system: Secondary | ICD-10-CM | POA: Diagnosis not present

## 2020-04-12 DIAGNOSIS — Z944 Liver transplant status: Secondary | ICD-10-CM | POA: Diagnosis not present

## 2020-04-12 DIAGNOSIS — K219 Gastro-esophageal reflux disease without esophagitis: Secondary | ICD-10-CM | POA: Diagnosis not present

## 2020-04-12 DIAGNOSIS — I10 Essential (primary) hypertension: Secondary | ICD-10-CM | POA: Diagnosis not present

## 2020-04-12 DIAGNOSIS — Z0000 Encounter for general adult medical examination without abnormal findings: Secondary | ICD-10-CM | POA: Diagnosis not present

## 2020-04-16 DIAGNOSIS — K5190 Ulcerative colitis, unspecified, without complications: Secondary | ICD-10-CM | POA: Diagnosis not present

## 2020-04-16 DIAGNOSIS — K900 Celiac disease: Secondary | ICD-10-CM | POA: Diagnosis not present

## 2020-04-16 DIAGNOSIS — Z944 Liver transplant status: Secondary | ICD-10-CM | POA: Diagnosis not present

## 2021-08-18 ENCOUNTER — Ambulatory Visit (INDEPENDENT_AMBULATORY_CARE_PROVIDER_SITE_OTHER): Payer: Medicare Other

## 2021-08-18 ENCOUNTER — Encounter: Payer: Self-pay | Admitting: Podiatry

## 2021-08-18 ENCOUNTER — Ambulatory Visit: Payer: Medicare Other | Admitting: Podiatry

## 2021-08-18 ENCOUNTER — Other Ambulatory Visit: Payer: Self-pay

## 2021-08-18 DIAGNOSIS — M79672 Pain in left foot: Secondary | ICD-10-CM | POA: Diagnosis not present

## 2021-08-18 DIAGNOSIS — M722 Plantar fascial fibromatosis: Secondary | ICD-10-CM | POA: Diagnosis not present

## 2021-08-18 MED ORDER — DEXAMETHASONE SODIUM PHOSPHATE 120 MG/30ML IJ SOLN
4.0000 mg | Freq: Once | INTRAMUSCULAR | Status: AC
  Administered 2021-08-18: 4 mg via INTRA_ARTICULAR

## 2021-08-18 NOTE — Patient Instructions (Signed)

## 2021-08-18 NOTE — Progress Notes (Signed)
  Subjective:  Patient ID: Paul Fuentes, male    DOB: Oct 23, 1955,   MRN: [id number]  Chief Complaint  Patient presents with   Foot Pain    Left heel pain - patient states this has been ongoing for months , he states it is hard to wear shoes and also for him to walk. He states when he is in pain nothing helps but to rest his foot . Sometimes he tries to put a heating pad on his foot but states it is just a temporary fix    65 y.o. male presents for left heel pain that has been going on for a couple months. Relates painful when he wakes up in the morning and takes first steps. Has tried heating pad and different shoes with some relief . Denies any other pedal complaints. Denies n/v/f/c.   Past Medical History:  Diagnosis Date   History of DVT (deep vein thrombosis) 06/29/2016   History of distal right lower extremity DVT in 1998, treated with 3 months of warfarin.  History of unprovoked distal left lower extra may DVT in 2000, 6 weeks of enoxaparin.  History of autoimmune hepatitis and liver transplant in 2008, complicated by lower extremity DVT.  History of ulcerative colitis, status post colectomy 2001 Hematology consult 03/2016. Regarding recommendation for treatment of her current pains, patient with multiple DVT, vascular surgery requested second opinion. Hematology noted would be reasonable to recommend anticoagulation but patient was not interested in this. Was encouraged to consider prophylactic anticoagulation in increased risk areas such as surgery. Recommended rivaroxaban 10 mg daily or apixiban 2.5 mg twice a day 3-5 days before a procedure and up to one month after the procedure. Blood pressure 169/113 per records in June 2017   Hypertension    Liver transplant recipient [facility]) 06/29/2016   Ulcerative colitis (HCC)     Objective:  Physical Exam: Vascular: DP/PT pulses 2/4 bilateral. CFT <3 seconds. Normal hair growth on digits. No edema.  Skin. No lacerations or abrasions bilateral feet.   Musculoskeletal: MMT 5/5 bilateral lower extremities in DF, PF, Inversion and Eversion. Deceased ROM in DF of ankle joint. Tender to medial calcaneal tubercle. No pain along achilles or PT tendon. No pain with medial squeeze of calcaneus.  Neurological: Sensation intact to light touch.   Assessment:   1. Pain of left heel      Plan:  Patient was evaluated and treated and all questions answered. Discussed plantar fasciitis with patient.  X-rays reviewed and discussed with patient. No acute fractures or dislocations noted. Mild spurring noted at inferior calcaneus.  Discussed treatment options including, ice, NSAIDS, supportive shoes, bracing, and stretching. Stretching exercises provided to be done on a daily basis.   Prescription meloxicam deferred due to liver transplant.  Patient requesting injection today. Procedure note below.   Discussed upon return trying custom orthotics.  Follow-up 6 weeks or sooner if any problems arise. In the meantime, encouraged to call the office with any questions, concerns, change in symptoms.   Procedure:  Discussed etiology, pathology, conservative vs. surgical therapies. At this time a plantar fascial injection was recommended.  The patient agreed and a sterile skin prep was applied.  An injection consisting of  dexamethasone and marcaine mixture was infiltrated at the point of maximal tenderness on the left Heel.  Bandaid applied. The patient tolerated this well and was given instructions for aftercare.    Louann Sjogren, DPM

## 2021-09-29 ENCOUNTER — Encounter: Payer: Self-pay | Admitting: Podiatry

## 2021-09-29 ENCOUNTER — Ambulatory Visit: Payer: Medicare Other | Admitting: Podiatry

## 2021-09-29 ENCOUNTER — Other Ambulatory Visit: Payer: Self-pay

## 2021-09-29 DIAGNOSIS — M722 Plantar fascial fibromatosis: Secondary | ICD-10-CM | POA: Diagnosis not present

## 2021-09-29 MED ORDER — DEXAMETHASONE SODIUM PHOSPHATE 120 MG/30ML IJ SOLN
4.0000 mg | Freq: Once | INTRAMUSCULAR | Status: AC
Start: 2021-09-29 — End: 2021-09-29
  Administered 2021-09-29: 4 mg via INTRA_ARTICULAR

## 2021-09-29 NOTE — Progress Notes (Signed)
°  Subjective:  Patient ID: Paul Fuentes, male    DOB: 1956-08-17,   MRN: [id number]  No chief complaint on file.   65 y.o. male presents for follow-up of left plantar fasciitis. Relates that the pain is about the same since his last visit. Relates he does not think the injection helped much. Has been stretching with some relief. He has also started using new shoes and inserts which are relieving the pain.  . Denies any other pedal complaints. Denies n/v/f/c.   Past Medical History:  Diagnosis Date   History of DVT (deep vein thrombosis) 06/29/2016   History of distal right lower extremity DVT in 1998, treated with 3 months of warfarin.  History of unprovoked distal left lower extra may DVT in 2000, 6 weeks of enoxaparin.  History of autoimmune hepatitis and liver transplant in 2008, complicated by lower extremity DVT.  History of ulcerative colitis, status post colectomy 2001 Hematology consult 03/2016. Regarding recommendation for treatment of her current pains, patient with multiple DVT, vascular surgery requested second opinion. Hematology noted would be reasonable to recommend anticoagulation but patient was not interested in this. Was encouraged to consider prophylactic anticoagulation in increased risk areas such as surgery. Recommended rivaroxaban 10 mg daily or apixiban 2.5 mg twice a day 3-5 days before a procedure and up to one month after the procedure. Blood pressure 169/113 per records in June 2017   Hypertension    Liver transplant recipient [facility]) 06/29/2016   Ulcerative colitis (HCC)     Objective:  Physical Exam: Vascular: DP/PT pulses 2/4 bilateral. CFT <3 seconds. Normal hair growth on digits. No edema.  Skin. No lacerations or abrasions bilateral feet.  Musculoskeletal: MMT 5/5 bilateral lower extremities in DF, PF, Inversion and Eversion. Deceased ROM in DF of ankle joint. Tender to medial calcaneal tubercle. No pain along achilles or PT tendon. No pain with medial squeeze of  calcaneus.  Neurological: Sensation intact to light touch.   Assessment:   1. Plantar fasciitis of left foot       Plan:  Patient was evaluated and treated and all questions answered. Discussed plantar fasciitis with patient.  X-rays reviewed and discussed with patient. No acute fractures or dislocations noted. Mild spurring noted at inferior calcaneus.  Discussed treatment options including, ice, NSAIDS, supportive shoes, bracing, and stretching. Stretching exercises provided to be done on a daily basis.   Prescription meloxicam deferred due to liver transplant.  Patient requesting injection today. Procedure note below.   Referral for PT provided.  Discussed upon return trying custom orthotics.  Follow-up 6 weeks or sooner if any problems arise. In the meantime, encouraged to call the office with any questions, concerns, change in symptoms.   Procedure:  Discussed etiology, pathology, conservative vs. surgical therapies. At this time a plantar fascial injection was recommended.  The patient agreed and a sterile skin prep was applied.  An injection consisting of  dexamethasone and marcaine mixture was infiltrated at the point of maximal tenderness on the left Heel.  Bandaid applied. The patient tolerated this well and was given instructions for aftercare.    Louann Sjogren, DPM

## 2021-11-17 ENCOUNTER — Encounter: Payer: Self-pay | Admitting: Podiatry

## 2021-11-17 ENCOUNTER — Other Ambulatory Visit: Payer: Self-pay

## 2021-11-17 ENCOUNTER — Ambulatory Visit: Payer: Medicare Other | Admitting: Podiatry

## 2021-11-17 DIAGNOSIS — M722 Plantar fascial fibromatosis: Secondary | ICD-10-CM

## 2021-11-17 MED ORDER — DEXAMETHASONE SODIUM PHOSPHATE 120 MG/30ML IJ SOLN: 4.0000 mg | Freq: Once | INTRAMUSCULAR | Status: AC

## 2021-11-17 NOTE — Progress Notes (Signed)
°  Subjective:  Patient ID: Paul Fuentes, male    DOB: [birth year],   MRN: [id number]  Chief Complaint  Patient presents with   Plantar Fasciitis    Patient states left foot is not improving there is still some pain, swelling and redness     66 y.o. male presents for follow-up of left plantar fasciitis. Relates that the pain is about the same since his last visit. Relates he does not think the injection helped much. Started PT and done some stretching will be trying some dry needling. He has also started using new shoes and inserts which help some. Denies any other pedal complaints. Denies n/v/f/c.   Past Medical History:  Diagnosis Date   History of DVT (deep vein thrombosis) 06/29/2016   History of distal right lower extremity DVT in 1998, treated with 3 months of warfarin.  History of unprovoked distal left lower extra may DVT in 2000, 6 weeks of enoxaparin.  History of autoimmune hepatitis and liver transplant in [id number], complicated by lower extremity DVT.  History of ulcerative colitis, status post colectomy 2001 Hematology consult 03/2016. Regarding recommendation for treatment of her current pains, patient with multiple DVT, vascular surgery requested second opinion. Hematology noted would be reasonable to recommend anticoagulation but patient was not interested in this. Was encouraged to consider prophylactic anticoagulation in increased risk areas such as surgery. Recommended rivaroxaban 10 mg daily or apixiban 2.5 mg twice a day 3-5 days before a procedure and up to one month after the procedure. Blood pressure 169/113 per records in June 2017   Hypertension    Liver transplant recipient [facility]) 06/29/2016   Ulcerative colitis ([place])     Objective:  Physical Exam: Vascular: DP/PT pulses 2/4 bilateral. CFT <3 seconds. Normal hair growth on digits. No edema.  Skin. No lacerations or abrasions bilateral feet.  Musculoskeletal: MMT 5/5 bilateral lower extremities in DF, PF, Inversion and Eversion.  Deceased ROM in DF of ankle joint. Tender to medial calcaneal tubercle. No pain along achilles or PT tendon. No pain with medial squeeze of calcaneus.  Neurological: Sensation intact to light touch.   Assessment:   1. Plantar fasciitis of left foot       Plan:  Patient was evaluated and treated and all questions answered. Discussed plantar fasciitis with patient.  X-rays reviewed and discussed with patient. No acute fractures or dislocations noted. Mild spurring noted at inferior calcaneus.  Discussed treatment options including, ice, NSAIDS, supportive shoes, bracing, and stretching.  Continue stretching Prescription meloxicam deferred due to liver transplant.  Patient requesting injection today. Procedure note below.   PT will be trying dry needling.  Will be going to [employer] to try new shoes and inserts.  Follow-up 6 weeks or sooner if any problems arise. Discussed if no improvement may consider MRI at that time and PRP/EPAT. In the meantime, encouraged to call the office with any questions, concerns, change in symptoms.   Procedure:  Discussed etiology, pathology, conservative vs. surgical therapies. At this time a plantar fascial injection was recommended.  The patient agreed and a sterile skin prep was applied.  An injection consisting of  dexamethasone and marcaine mixture was infiltrated at the point of maximal tenderness on the left Heel.  Bandaid applied. The patient tolerated this well and was given instructions for aftercare.    Lorenda Peck, DPM

## 2021-12-29 ENCOUNTER — Ambulatory Visit: Payer: Medicare Other | Admitting: Podiatry

## 2021-12-30 ENCOUNTER — Ambulatory Visit: Payer: Medicare Other | Admitting: Podiatry

## 2021-12-30 ENCOUNTER — Other Ambulatory Visit: Payer: Self-pay

## 2021-12-30 ENCOUNTER — Encounter: Payer: Self-pay | Admitting: Podiatry

## 2021-12-30 DIAGNOSIS — M722 Plantar fascial fibromatosis: Secondary | ICD-10-CM | POA: Diagnosis not present

## 2021-12-30 NOTE — Progress Notes (Signed)
?  Subjective:  ?Patient ID: Paul Fuentes, male    DOB: 10-Dec-1955,   MRN: [id number] ? ?No chief complaint on file. ? ? ? ?66 y.o. male presents for follow-up of left plantar fasciitis. Relates that the pain is doing better. He still has some pain but it is getting better. Relates the injection did help and is wearing supportive shoes. Has also been doing the dry needling.  Denies any other pedal complaints. Denies n/v/f/c.  ? ?Past Medical History:  ?Diagnosis Date  ? History of DVT (deep vein thrombosis) 06/29/2016  ? History of distal right lower extremity DVT in 1998, treated with 3 months of warfarin.  History of unprovoked distal left lower extra may DVT in 2000, 6 weeks of enoxaparin.  History of autoimmune hepatitis and liver transplant in 2008, complicated by lower extremity DVT.  History of ulcerative colitis, status post colectomy 2001 Hematology consult 03/2016. Regarding recommendation for treatment of her current pains, patient with multiple DVT, vascular surgery requested second opinion. Hematology noted would be reasonable to recommend anticoagulation but patient was not interested in this. Was encouraged to consider prophylactic anticoagulation in increased risk areas such as surgery. Recommended rivaroxaban 10 mg daily or apixiban 2.5 mg twice a day 3-5 days before a procedure and up to one month after the procedure. Blood pressure 169/113 per records in June 2017  ? Hypertension   ? Liver transplant recipient [facility]) 06/29/2016  ? Ulcerative colitis (HCC)   ? ? ?Objective:  ?Physical Exam: ?Vascular: DP/PT pulses 2/4 bilateral. CFT <3 seconds. Normal hair growth on digits. No edema.  ?Skin. No lacerations or abrasions bilateral feet.  ?Musculoskeletal: MMT 5/5 bilateral lower extremities in DF, PF, Inversion and Eversion. Deceased ROM in DF of ankle joint. Minimally tender to medial calcaneal tubercle. No pain along achilles or PT tendon. No pain with medial squeeze of calcaneus.  ?Neurological: Sensation  intact to light touch.  ? ?Assessment:  ? ?1. Plantar fasciitis of left foot   ? ? ? ? ? ?Plan:  ?Patient was evaluated and treated and all questions answered. ?Discussed plantar fasciitis with patient.  ?X-rays reviewed and discussed with patient. No acute fractures or dislocations noted. Mild spurring noted at inferior calcaneus.  ?Discussed treatment options including, ice, NSAIDS, supportive shoes, bracing, and stretching.  ?Continue stretching ?Prescription meloxicam deferred due to liver transplant.   ?Continue PT and modalities  ?Continue with supportive shoes   ?Follow-up as needed ? ? ? ?Louann Sjogren, DPM  ? ? ?

## 2022-12-08 ENCOUNTER — Other Ambulatory Visit: Payer: Self-pay | Admitting: Nurse Practitioner

## 2022-12-08 ENCOUNTER — Encounter: Payer: Self-pay | Admitting: Rheumatology

## 2022-12-08 DIAGNOSIS — Z13820 Encounter for screening for osteoporosis: Secondary | ICD-10-CM

## 2023-02-16 ENCOUNTER — Ambulatory Visit (INDEPENDENT_AMBULATORY_CARE_PROVIDER_SITE_OTHER): Payer: Medicare Other

## 2023-02-16 ENCOUNTER — Encounter: Payer: Self-pay | Admitting: Podiatry

## 2023-02-16 ENCOUNTER — Ambulatory Visit: Payer: Medicare Other | Admitting: Podiatry

## 2023-02-16 DIAGNOSIS — M722 Plantar fascial fibromatosis: Secondary | ICD-10-CM

## 2023-02-16 MED ORDER — DEXAMETHASONE SODIUM PHOSPHATE 120 MG/30ML IJ SOLN
4.0000 mg | Freq: Once | INTRAMUSCULAR | Status: AC
Start: 2023-02-16 — End: ?

## 2023-02-16 NOTE — Progress Notes (Signed)
  Subjective:  Patient ID: Paul Fuentes, male    DOB: 07-08-1956,   MRN: [id number]  Chief Complaint  Patient presents with   Plantar Fasciitis    Right foot PF pain on going for months     67 y.o. male presents for new concern now of pain in the right heel that started a few months ago. Relates the pain is similar on the right. Has been trying to stretch and wear supportive shoes but pain has continued. Relates left side doing ok . Denies any other pedal complaints. Denies n/v/f/c.   Past Medical History:  Diagnosis Date   History of DVT (deep vein thrombosis) 06/29/2016   History of distal right lower extremity DVT in 1998, treated with 3 months of warfarin.  History of unprovoked distal left lower extra may DVT in 2000, 6 weeks of enoxaparin.  History of autoimmune hepatitis and liver transplant in 2008, complicated by lower extremity DVT.  History of ulcerative colitis, status post colectomy 2001 Hematology consult 03/2016. Regarding recommendation for treatment of her current pains, patient with multiple DVT, vascular surgery requested second opinion. Hematology noted would be reasonable to recommend anticoagulation but patient was not interested in this. Was encouraged to consider prophylactic anticoagulation in increased risk areas such as surgery. Recommended rivaroxaban 10 mg daily or apixiban 2.5 mg twice a day 3-5 days before a procedure and up to one month after the procedure. Blood pressure 169/113 per records in June 2017   Hypertension    Liver transplant recipient [facility]) 06/29/2016   Ulcerative colitis (HCC)     Objective:  Physical Exam: Vascular: DP/PT pulses 2/4 bilateral. CFT <3 seconds. Normal hair growth on digits. No edema.  Skin. No lacerations or abrasions bilateral feet.  Musculoskeletal: MMT 5/5 bilateral lower extremities in DF, PF, Inversion and Eversion. Deceased ROM in DF of ankle joint. Tender to medial calcaneal tubercle on right. No pain to PT achilles or arch. NO  pain with calcaneal squeeze Neurological: Sensation intact to light touch.   Assessment:   1. Plantar fasciitis, right      Plan:  Patient was evaluated and treated and all questions answered. Discussed plantar fasciitis with patient.  X-rays reviewed and discussed with patient. No acute fractures or dislocations noted. Mild spurring noted at inferior calcaneus.  Discussed treatment options including, ice, NSAIDS, supportive shoes, bracing, and stretching. Continue stretching and support.  Discussed if continued problems to let me know and we could order PT again.  Patient requesting injection today. Procedure note below.   Follow-up 6 weeks or sooner if any problems arise. In the meantime, encouraged to call the office with any questions, concerns, change in symptoms.   Procedure:  Discussed etiology, pathology, conservative vs. surgical therapies. At this time a plantar fascial injection was recommended.  The patient agreed and a sterile skin prep was applied.  An injection consisting of  dexamethasone and marcaine mixture was infiltrated at the point of maximal tenderness on the right Heel.  Bandaid applied. The patient tolerated this well and was given instructions for aftercare.    Louann Sjogren, DPM

## 2023-02-16 NOTE — Patient Instructions (Signed)

## 2023-03-13 ENCOUNTER — Other Ambulatory Visit: Payer: Self-pay | Admitting: Podiatry

## 2023-03-13 ENCOUNTER — Encounter: Payer: Self-pay | Admitting: Podiatry

## 2023-03-13 DIAGNOSIS — M722 Plantar fascial fibromatosis: Secondary | ICD-10-CM

## 2023-04-27 ENCOUNTER — Ambulatory Visit: Payer: Medicare Other | Admitting: Podiatry

## 2023-04-27 ENCOUNTER — Encounter: Payer: Self-pay | Admitting: Podiatry

## 2023-04-27 DIAGNOSIS — M722 Plantar fascial fibromatosis: Secondary | ICD-10-CM

## 2023-04-27 MED ORDER — DEXAMETHASONE SODIUM PHOSPHATE 120 MG/30ML IJ SOLN
4.0000 mg | Freq: Once | INTRAMUSCULAR | Status: AC
Start: 2023-04-27 — End: ?

## 2023-04-27 MED ORDER — TRIAMCINOLONE ACETONIDE 10 MG/ML IJ SUSP
10.0000 mg | Freq: Once | INTRAMUSCULAR | Status: AC
Start: 2023-04-27 — End: ?

## 2023-04-27 NOTE — Progress Notes (Signed)
  Subjective:  Patient ID: Paul Fuentes, male    DOB: 01/09/1956,   MRN: [id number]  Chief Complaint  Patient presents with   Plantar Fasciitis    Right foot PF pain is still there , patient states it has not gone away    67 y.o. male presents for follow-up of right plantar fasciitis. Relates the last injection did not help and still getting pain. Has been wearing [place]. Interested in orthotics.  Relates left side doing ok . Denies any other pedal complaints. Denies n/v/f/c.   Past Medical History:  Diagnosis Date   History of DVT (deep vein thrombosis) 06/29/2016   History of distal right lower extremity DVT in 1998, treated with 3 months of warfarin.  History of unprovoked distal left lower extra may DVT in 2000, 6 weeks of enoxaparin.  History of autoimmune hepatitis and liver transplant in 2008, complicated by lower extremity DVT.  History of ulcerative colitis, status post colectomy 2001 Hematology consult 03/2016. Regarding recommendation for treatment of her current pains, patient with multiple DVT, vascular surgery requested second opinion. Hematology noted would be reasonable to recommend anticoagulation but patient was not interested in this. Was encouraged to consider prophylactic anticoagulation in increased risk areas such as surgery. Recommended rivaroxaban 10 mg daily or apixiban 2.5 mg twice a day 3-5 days before a procedure and up to one month after the procedure. Blood pressure 169/113 per records in June 2017   Hypertension    Liver transplant recipient [facility]) 06/29/2016   Ulcerative colitis (HCC)     Objective:  Physical Exam: Vascular: DP/PT pulses 2/4 bilateral. CFT <3 seconds. Normal hair growth on digits. No edema.  Skin. No lacerations or abrasions bilateral feet.  Musculoskeletal: MMT 5/5 bilateral lower extremities in DF, PF, Inversion and Eversion. Deceased ROM in DF of ankle joint. Tender to medial calcaneal tubercle on right. No pain to PT achilles or arch. NO pain with  calcaneal squeeze Neurological: Sensation intact to light touch.   Assessment:   1. Plantar fasciitis, right       Plan:  Patient was evaluated and treated and all questions answered. Discussed plantar fasciitis with patient.  X-rays reviewed and discussed with patient. No acute fractures or dislocations noted. Mild spurring noted at inferior calcaneus.  Discussed treatment options including, ice, NSAIDS, supportive shoes, bracing, and stretching. Continue stretching and support.  Amb ref to PT.  Patient requesting injection today. Procedure note below.  Will see about prior authorization for orthotics.   Follow-up 6 weeks or sooner if any problems arise. In the meantime, encouraged to call the office with any questions, concerns, change in symptoms.   Procedure:  Discussed etiology, pathology, conservative vs. surgical therapies. At this time a plantar fascial injection was recommended.  The patient agreed and a sterile skin prep was applied.  An injection consisting of  dexamethasone, kenalog and marcaine mixture was infiltrated at the point of maximal tenderness on the right Heel.  Bandaid applied. The patient tolerated this well and was given instructions for aftercare.    Louann Sjogren, DPM

## 2023-05-17 ENCOUNTER — Inpatient Hospital Stay: Admission: RE | Admit: 2023-05-17 | Payer: Medicare Other | Source: Ambulatory Visit

## 2023-06-08 ENCOUNTER — Ambulatory Visit: Payer: Medicare Other | Admitting: Podiatry

## 2023-06-08 ENCOUNTER — Encounter: Payer: Self-pay | Admitting: Podiatry

## 2023-06-08 DIAGNOSIS — M722 Plantar fascial fibromatosis: Secondary | ICD-10-CM | POA: Diagnosis not present

## 2023-06-08 MED ORDER — DEXAMETHASONE SODIUM PHOSPHATE 120 MG/30ML IJ SOLN
4.0000 mg | Freq: Once | INTRAMUSCULAR | Status: AC
Start: 2023-06-08 — End: 2023-06-08
  Administered 2023-06-08: 4 mg via INTRA_ARTICULAR

## 2023-06-08 MED ORDER — TRIAMCINOLONE ACETONIDE 10 MG/ML IJ SUSP
2.5000 mg | Freq: Once | INTRAMUSCULAR | Status: AC
Start: 2023-06-08 — End: 2023-06-08
  Administered 2023-06-08: 2.5 mg via INTRA_ARTICULAR

## 2023-06-08 NOTE — Progress Notes (Signed)
  Subjective:  Patient ID: Paul Fuentes, male    DOB: [birth year],   MRN: [id number]  Chief Complaint  Patient presents with   Plantar Fasciitis    67 y.o. male presents for follow-up of right plantar fasciitis. Relates the last injection did help but still only about 50% better. He has been in PT as well. Hoping for another injection.   Relates left side doing ok . Denies any other pedal complaints. Denies n/v/f/c.   Past Medical History:  Diagnosis Date   History of DVT (deep vein thrombosis) 06/29/2016   History of distal right lower extremity DVT in 1998, treated with 3 months of warfarin.  History of unprovoked distal left lower extra may DVT in 2000, 6 weeks of enoxaparin.  History of autoimmune hepatitis and liver transplant in 2008, complicated by lower extremity DVT.  History of ulcerative colitis, status post colectomy 2001 Hematology consult 03/2016. Regarding recommendation for treatment of her current pains, patient with multiple DVT, vascular surgery requested second opinion. Hematology noted would be reasonable to recommend anticoagulation but patient was not interested in this. Was encouraged to consider prophylactic anticoagulation in increased risk areas such as surgery. Recommended rivaroxaban 10 mg daily or apixiban 2.5 mg twice a day 3-5 days before a procedure and up to one month after the procedure. Blood pressure 169/113 per records in June 2017   Hypertension    Liver transplant recipient [facility]) 06/29/2016   Ulcerative colitis (HCC)     Objective:  Physical Exam: Vascular: DP/PT pulses 2/4 bilateral. CFT <3 seconds. Normal hair growth on digits. No edema.  Skin. No lacerations or abrasions bilateral feet.  Musculoskeletal: MMT 5/5 bilateral lower extremities in DF, PF, Inversion and Eversion. Deceased ROM in DF of ankle joint. Mildly tender to medial calcaneal tubercle on right. No pain to PT achilles or arch. NO pain with calcaneal squeeze Neurological: Sensation intact to  light touch.   Assessment:   1. Plantar fasciitis, right        Plan:  Patient was evaluated and treated and all questions answered. Discussed plantar fasciitis with patient.  X-rays reviewed and discussed with patient. No acute fractures or dislocations noted. Mild spurring noted at inferior calcaneus.  Discussed treatment options including, ice, NSAIDS, supportive shoes, bracing, and stretching. Continue PT. Marland Kitchen  Patient requesting injection today. Procedure note below.  Awaiting inserts he ordered.    Follow-up as needed.   Procedure:  Discussed etiology, pathology, conservative vs. surgical therapies. At this time a plantar fascial injection was recommended.  The patient agreed and a sterile skin prep was applied.  An injection consisting of  dexamethasone, kenalog and marcaine mixture was infiltrated at the point of maximal tenderness on the right Heel.  Bandaid applied. The patient tolerated this well and was given instructions for aftercare.    Louann Sjogren, DPM

## 2023-10-15 IMAGING — DX DG FOOT COMPLETE 3+V*L*
3 series · 3 of 3 positions shown · non-contrast
Comparison: None.

CLINICAL DATA: Left heel pain

EXAM:
LEFT FOOT - COMPLETE 3+ VIEW

[foot ap wb]
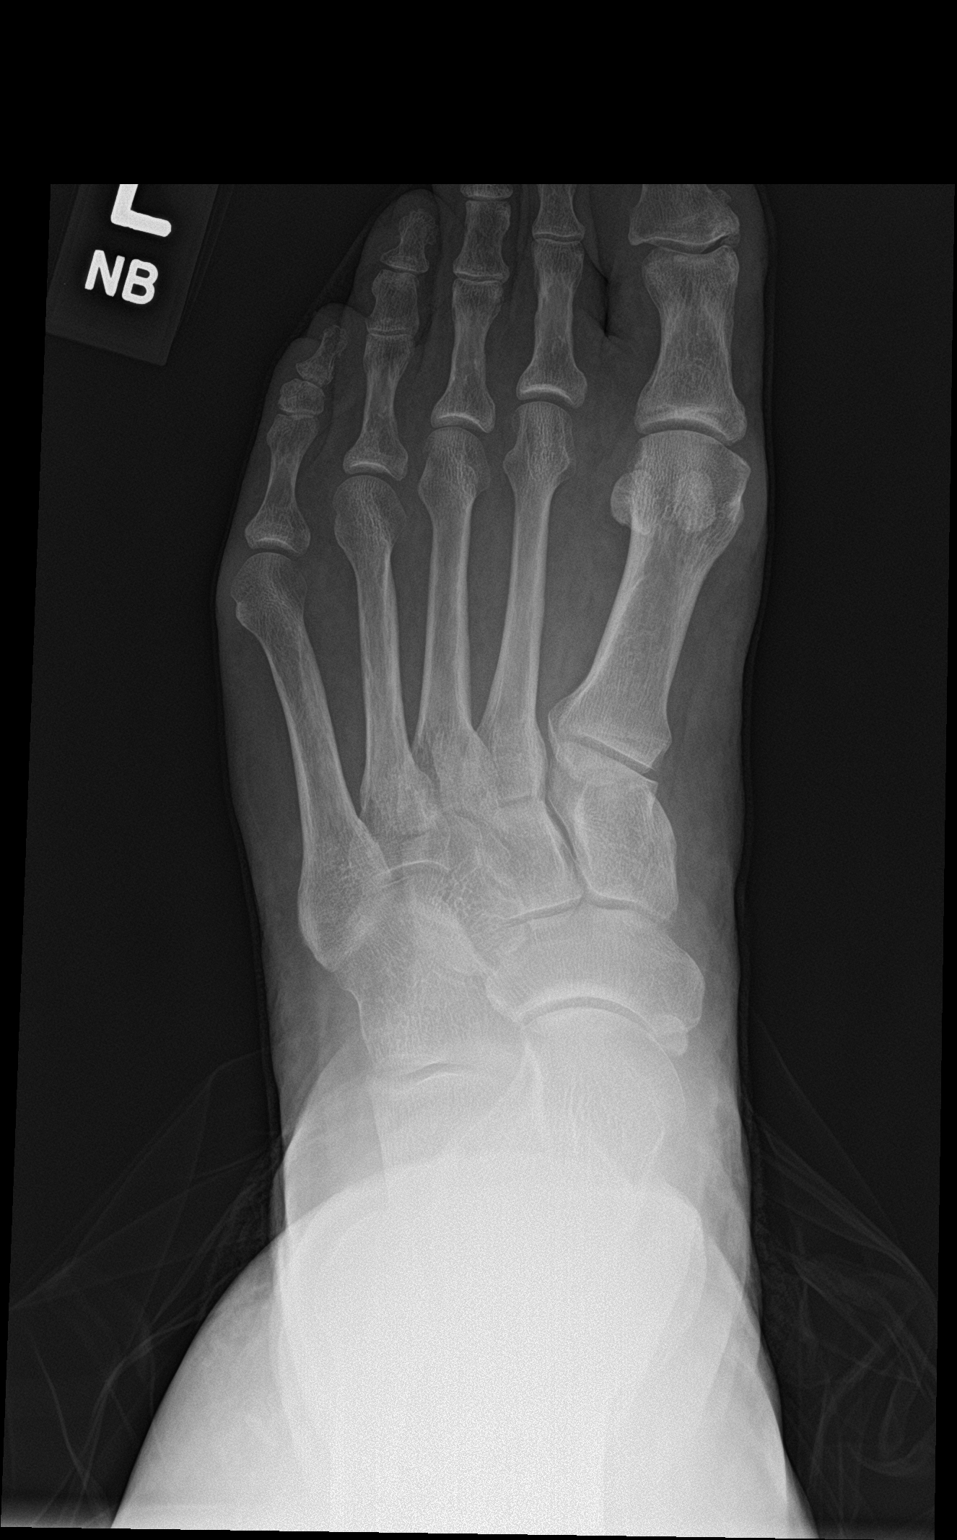

[foot obl wb]
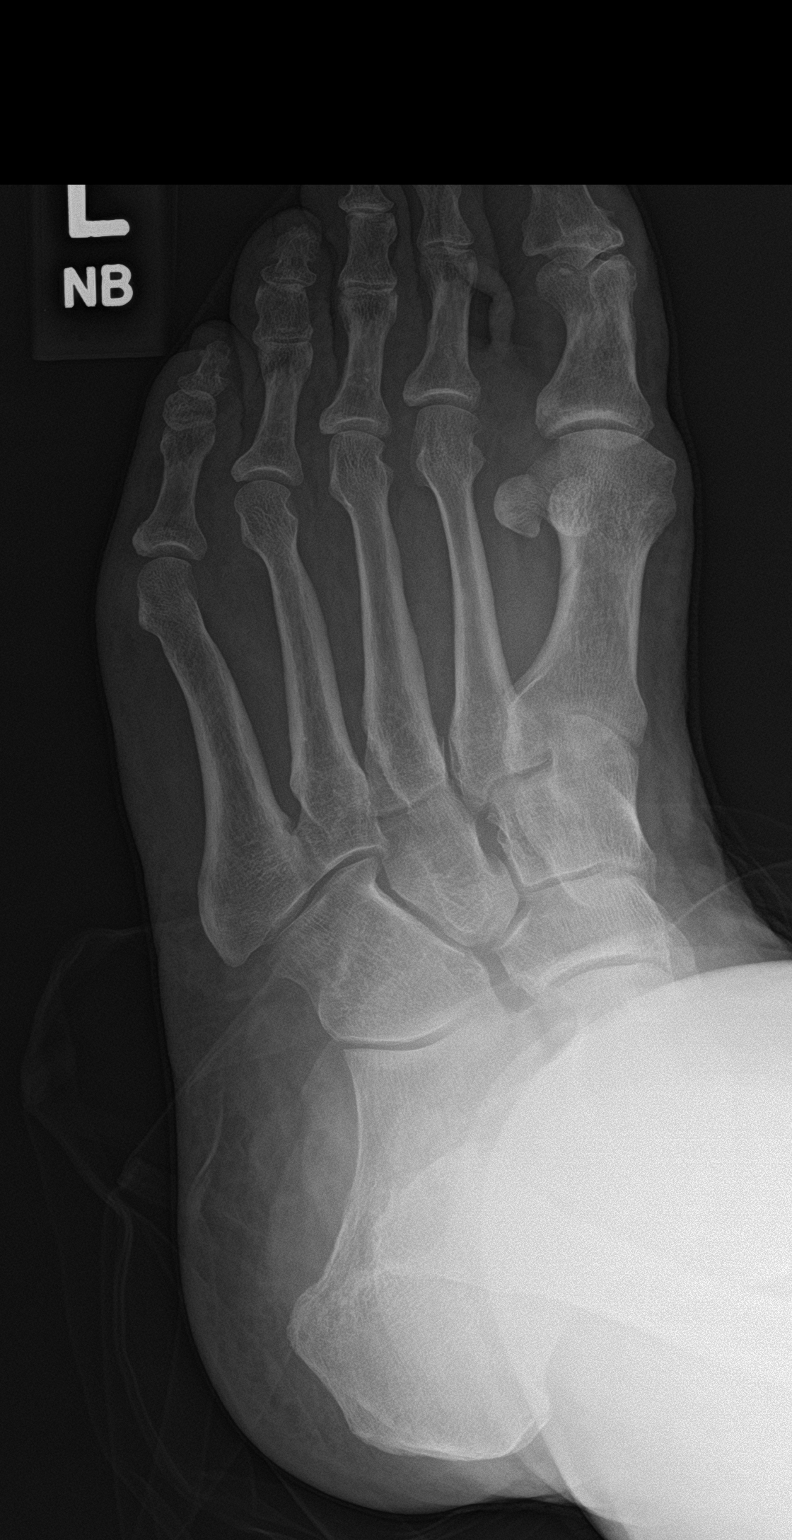

[foot lat wb]
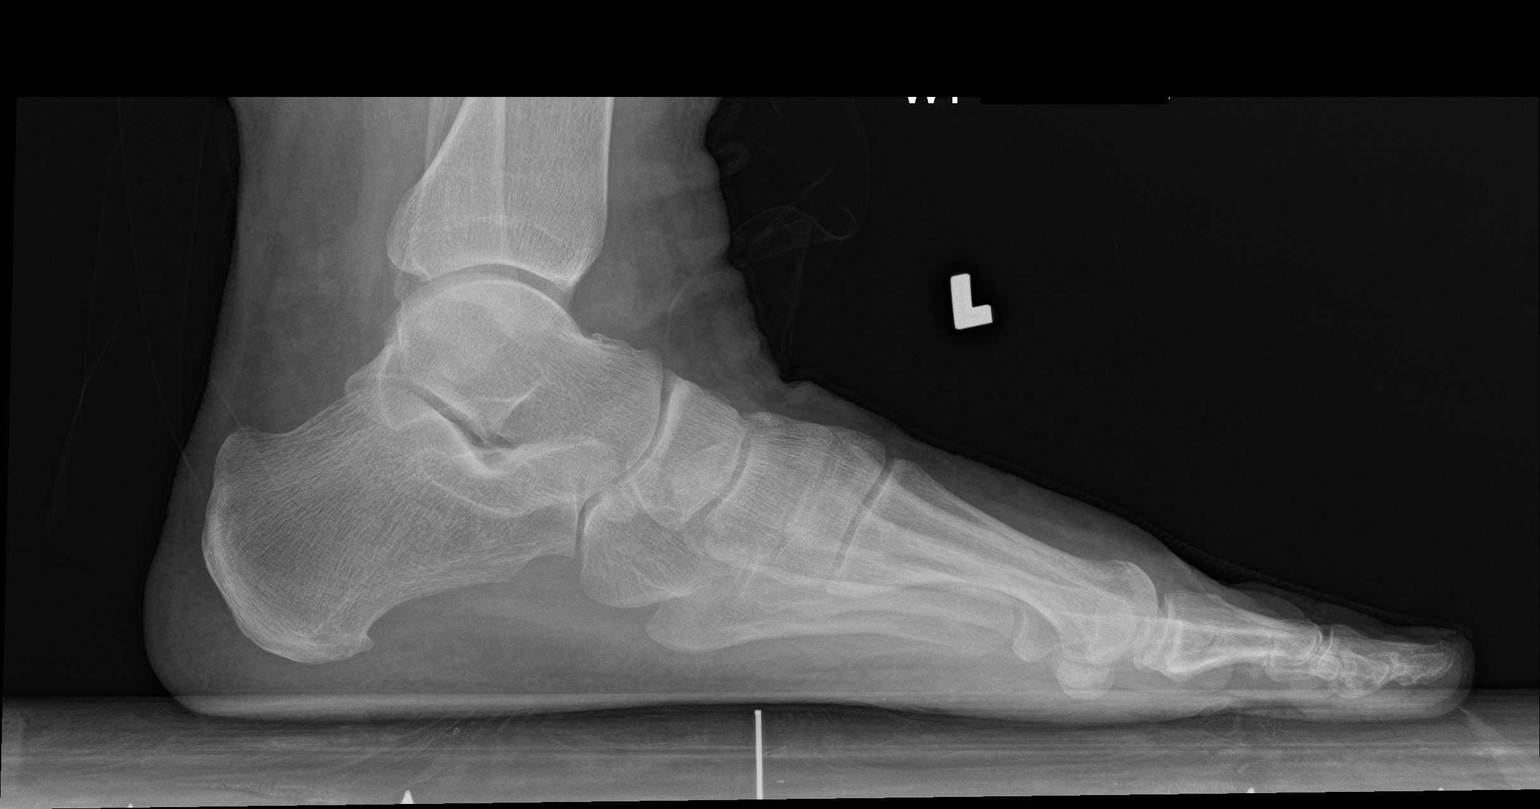

[3 of 3 positions shown; findings below may reference images not displayed]

FINDINGS: There is no evidence of fracture or dislocation. There is no
evidence of arthropathy or other focal bone abnormality. Soft
tissues are unremarkable.
IMPRESSION: Negative.

## 2023-11-20 ENCOUNTER — Ambulatory Visit
Admission: RE | Admit: 2023-11-20 | Discharge: 2023-11-20 | Disposition: A | Discharge status: Home / Self Care | Payer: Medicare Other | Source: Ambulatory Visit | Attending: Nurse Practitioner

## 2023-11-20 DIAGNOSIS — Z13820 Encounter for screening for osteoporosis: Secondary | ICD-10-CM
